# Patient Record
Sex: Male | Born: 1970 | Race: Black or African American | Hispanic: No | Marital: Married | Smoking: Former smoker
Health system: Southern US, Community
[De-identification: ages and names within clinical notes are randomized; demographics above are authoritative.]

## PROBLEM LIST (undated history)

## (undated) DIAGNOSIS — I10 Essential (primary) hypertension: Secondary | ICD-10-CM

## (undated) DIAGNOSIS — E119 Type 2 diabetes mellitus without complications: Secondary | ICD-10-CM

---

## 2019-10-06 ENCOUNTER — Emergency Department (HOSPITAL_COMMUNITY): Payer: Medicaid - Out of State

## 2019-10-06 ENCOUNTER — Emergency Department (HOSPITAL_COMMUNITY)
Admission: EM | Admit: 2019-10-06 | Discharge: 2019-10-06 | Disposition: A | Discharge status: Home / Self Care | Payer: Medicaid - Out of State | Attending: Emergency Medicine | Admitting: Emergency Medicine

## 2019-10-06 ENCOUNTER — Encounter (HOSPITAL_COMMUNITY): Payer: Self-pay

## 2019-10-06 ENCOUNTER — Other Ambulatory Visit: Payer: Self-pay

## 2019-10-06 DIAGNOSIS — U071 COVID-19: Secondary | ICD-10-CM | POA: Diagnosis not present

## 2019-10-06 DIAGNOSIS — E119 Type 2 diabetes mellitus without complications: Secondary | ICD-10-CM | POA: Insufficient documentation

## 2019-10-06 DIAGNOSIS — I10 Essential (primary) hypertension: Secondary | ICD-10-CM | POA: Diagnosis not present

## 2019-10-06 DIAGNOSIS — R05 Cough: Secondary | ICD-10-CM | POA: Diagnosis present

## 2019-10-06 DIAGNOSIS — Z20822 Contact with and (suspected) exposure to covid-19: Secondary | ICD-10-CM

## 2019-10-06 HISTORY — DX: Type 2 diabetes mellitus without complications: E11.9

## 2019-10-06 HISTORY — DX: Essential (primary) hypertension: I10

## 2019-10-06 MED ORDER — BENZONATATE 100 MG PO CAPS: 100.0000 mg | ORAL_CAPSULE | Freq: Three times a day (TID) | ORAL | 0 refills | Status: AC

## 2019-10-06 MED ORDER — ACETAMINOPHEN 500 MG PO TABS
1000.0000 mg | ORAL_TABLET | Freq: Once | ORAL | Status: AC
  Administered 2019-10-06: 16:00:00 1000 mg via ORAL
  Filled 2019-10-06: qty 2

## 2019-10-06 MED ORDER — ALBUTEROL SULFATE HFA 108 (90 BASE) MCG/ACT IN AERS
4.0000 | INHALATION_SPRAY | Freq: Once | RESPIRATORY_TRACT | Status: AC
  Administered 2019-10-06: 16:00:00 4 via RESPIRATORY_TRACT
  Filled 2019-10-06: qty 6.7

## 2019-10-06 MED ORDER — ACETAMINOPHEN 500 MG PO TABS: 500.0000 mg | ORAL_TABLET | Freq: Four times a day (QID) | ORAL | 0 refills | Status: AC | PRN

## 2019-10-06 NOTE — ED Provider Notes (Signed)
[facility]-EMERGENCY DEPT Provider Note   CSN: [id number] Arrival date & time: 10/06/19  1328     History Chief Complaint  Patient presents with  . Cough  . Generalized Body Aches  . Asthma    Jesse Washington is a 49 y.o. male.  The history is provided by the patient. No language interpreter was used.  Cough Asthma     49 year old male with history of asthma, diabetes, hypertension presents with cold symptoms.  Patient report for the past 3 days he has had fever, chills, body aches, pain in his back, legs, having nonproductive cough, shortness of breath, generalized weakness, decrease in appetite, headache.  Symptoms moderate in severity, no associated loss of taste or smell no nausea vomiting or diarrhea no dysuria or rash.  Denies any recent sick contact.  He lives in [place], and have been down in [place] for the past month to be with his daughter.  He has been drinking plenty of fluid at home but not feeling any better.  He denies any recent exposure to COVID-19.  Does have history of asthma but denies any increased wheezing.  Past Medical History:  Diagnosis Date  . Diabetes mellitus without complication (HCC)   . Hypertension     There are no problems to display for this patient.   History reviewed. No pertinent surgical history.     History reviewed. No pertinent family history.  Social History   Tobacco Use  . Smoking status: Not on file  Substance Use Topics  . Alcohol use: Not on file  . Drug use: Not on file    Home Medications Prior to Admission medications   Not on File    Allergies    Ciprofloxacin  Review of Systems   Review of Systems  Respiratory: Positive for cough.   All other systems reviewed and are negative.   Physical Exam Updated Vital Signs BP (!) 133/92   Pulse (!) 121   Temp (!) 100.7 F (38.2 C) (Oral)   Resp 20   SpO2 97%   Physical Exam Vitals and nursing note reviewed.    Constitutional:      General: He is not in acute distress.    Appearance: He is well-developed. He is obese.  HENT:     Head: Atraumatic.  Eyes:     Conjunctiva/sclera: Conjunctivae normal.  Cardiovascular:     Rate and Rhythm: Tachycardia present.     Pulses: Normal pulses.     Heart sounds: Normal heart sounds.  Pulmonary:     Breath sounds: No wheezing, rhonchi or rales.  Abdominal:     Palpations: Abdomen is soft.     Tenderness: There is no abdominal tenderness.  Musculoskeletal:     Cervical back: Neck supple.  Skin:    Findings: No rash.  Neurological:     Mental Status: He is alert and oriented to person, place, and time.  Psychiatric:        Mood and Affect: Mood normal.     ED Results / Procedures / Treatments   Labs (all labs ordered are listed, but only abnormal results are displayed) Labs Reviewed  SARS CORONAVIRUS 2 (TAT 6-24 HRS)    EKG None  Radiology DG Chest 2 View  Result Date: 10/06/2019 CLINICAL DATA:  Cough and pain.  Hypertension. EXAM: CHEST - 2 VIEW COMPARISON:  None. FINDINGS: Lungs are clear. Heart size and pulmonary vascularity are normal. No adenopathy. No pneumothorax. There is degenerative  change in the thoracic spine. IMPRESSION: Lungs clear.  Cardiac silhouette normal.  No adenopathy. Electronically Signed   By: Lowella Grip III M.D.   On: 10/06/2019 14:28    Procedures Procedures (including critical care time)  Medications Ordered in ED Medications  acetaminophen (TYLENOL) tablet 1,000 mg (1,000 mg Oral Given 10/06/19 1557)  albuterol (VENTOLIN HFA) 108 (90 Base) MCG/ACT inhaler 4 puff (4 puffs Inhalation Given 10/06/19 1558)    ED Course  I have reviewed the triage vital signs and the nursing notes.  Pertinent labs & imaging results that were available during my care of the patient were reviewed by me and considered in my medical decision making (see chart for details).    MDM Rules/Calculators/A&P                       BP (!) 133/92   Pulse (!) 121   Temp (!) 100.7 F (38.2 C) (Oral)   Resp 20   SpO2 97%   Final Clinical Impression(s) / ED Diagnoses Final diagnoses:  Suspected COVID-19 virus infection    Rx / DC Orders ED Discharge Orders         Ordered    acetaminophen (TYLENOL) 500 MG tablet  Every 6 hours PRN     10/06/19 1705    benzonatate (TESSALON) 100 MG capsule  Every 8 hours     10/06/19 1705         3:53 PM Patient presents with symptoms concerning for COVID-19.  Fortunately he is not hypoxic or hypotensive.  Does have an elevated temperature of 100.7 and tachycardic with a heart rate of 121.  Will give Tylenol, fluid, obtain COVID-19 test and will reassess.  Jesse Washington was evaluated in Emergency Department on 10/06/2019 for the symptoms described in the history of present illness. He was evaluated in the context of the global COVID-19 pandemic, which necessitated consideration that the patient might be at risk for infection with the SARS-CoV-2 virus that causes COVID-19. Institutional protocols and algorithms that pertain to the evaluation of patients at risk for COVID-19 are in a state of rapid change based on information released by regulatory bodies including the CDC and federal and state organizations. These policies and algorithms were followed during the patient's care in the ED.    Domenic Moras, PA-C 10/06/19 1925    Nat Christen, MD 10/07/19 [phone]

## 2019-10-06 NOTE — ED Triage Notes (Addendum)
Patient c/o cough and body aches X3 days.   Denies chest pain  Also c/o ankle pain and back pain X1 month from a previous injury.    Temp-100.7  Patient from [place] and has been living here for 1 month.    Hx. Asthma, DM, HTN   Allergy-cipro-rash

## 2019-10-06 NOTE — Discharge Instructions (Signed)
Your symptoms are concerning for COVID-19 infection.  A test have been obtained and should result in the next 24 hrs.  Please check MyChart, link below for result.  Follow instruction below. Return if you have any concerns.

## 2019-10-06 NOTE — ED Notes (Signed)
An After Visit Summary was printed and given to the patient. Discharge instructions given and no further questions at this time.  Pt denies SOB.  

## 2019-10-06 NOTE — ED Notes (Signed)
ED Provider at bedside. 

## 2019-10-07 ENCOUNTER — Emergency Department (HOSPITAL_COMMUNITY)
Admission: EM | Admit: 2019-10-07 | Discharge: 2019-10-08 | Disposition: A | Discharge status: Home / Self Care | Payer: Medicaid - Out of State | Attending: Emergency Medicine | Admitting: Emergency Medicine

## 2019-10-07 ENCOUNTER — Encounter (HOSPITAL_COMMUNITY): Payer: Self-pay

## 2019-10-07 ENCOUNTER — Emergency Department (HOSPITAL_COMMUNITY): Payer: Medicaid - Out of State

## 2019-10-07 ENCOUNTER — Telehealth: Payer: Self-pay

## 2019-10-07 DIAGNOSIS — U071 COVID-19: Secondary | ICD-10-CM | POA: Diagnosis not present

## 2019-10-07 DIAGNOSIS — R739 Hyperglycemia, unspecified: Secondary | ICD-10-CM

## 2019-10-07 DIAGNOSIS — R52 Pain, unspecified: Secondary | ICD-10-CM

## 2019-10-07 DIAGNOSIS — E119 Type 2 diabetes mellitus without complications: Secondary | ICD-10-CM | POA: Diagnosis not present

## 2019-10-07 DIAGNOSIS — Z794 Long term (current) use of insulin: Secondary | ICD-10-CM | POA: Diagnosis not present

## 2019-10-07 DIAGNOSIS — R0602 Shortness of breath: Secondary | ICD-10-CM | POA: Diagnosis not present

## 2019-10-07 DIAGNOSIS — I10 Essential (primary) hypertension: Secondary | ICD-10-CM | POA: Diagnosis not present

## 2019-10-07 DIAGNOSIS — Z79899 Other long term (current) drug therapy: Secondary | ICD-10-CM | POA: Diagnosis not present

## 2019-10-07 DIAGNOSIS — R509 Fever, unspecified: Secondary | ICD-10-CM | POA: Diagnosis present

## 2019-10-07 LAB — URINALYSIS, ROUTINE W REFLEX MICROSCOPIC
Bacteria, UA: NONE SEEN
Bilirubin Urine: NEGATIVE
Glucose, UA: >=500 mg/dL — AB
Hgb urine dipstick: NEGATIVE
Ketones, ur: 80 mg/dL — AB
Leukocytes,Ua: NEGATIVE
Nitrite: NEGATIVE
Protein, ur: 30 mg/dL — AB
Specific Gravity, Urine: 1.032 — ABNORMAL HIGH (ref 1.005–1.030)
pH: 5 (ref 5.0–8.0)

## 2019-10-07 LAB — CBC WITH DIFFERENTIAL/PLATELET
Abs Immature Granulocytes: 0.04 10*3/uL (ref 0.00–0.07)
Basophils Absolute: 0.1 10*3/uL (ref 0.0–0.1)
Basophils Relative: 1 %
Eosinophils Absolute: 0.1 10*3/uL (ref 0.0–0.5)
Eosinophils Relative: 1 %
HCT: 49.5 % (ref 39.0–52.0)
Hemoglobin: 16.1 g/dL (ref 13.0–17.0)
Immature Granulocytes: 0 %
Lymphocytes Relative: 24 %
Lymphs Abs: 2.4 10*3/uL (ref 0.7–4.0)
MCH: 30.8 pg (ref 26.0–34.0)
MCHC: 32.5 g/dL (ref 30.0–36.0)
MCV: 94.6 fL (ref 80.0–100.0)
Monocytes Absolute: 2.1 10*3/uL — ABNORMAL HIGH (ref 0.1–1.0)
Monocytes Relative: 21 %
Neutro Abs: 5.3 10*3/uL (ref 1.7–7.7)
Neutrophils Relative %: 53 %
Platelets: 249 10*3/uL (ref 150–400)
RBC: 5.23 MIL/uL (ref 4.22–5.81)
RDW: 13.7 % (ref 11.5–15.5)
WBC: 9.9 10*3/uL (ref 4.0–10.5)
nRBC: 0 % (ref 0.0–0.2)

## 2019-10-07 LAB — COMPREHENSIVE METABOLIC PANEL
ALT: 33 U/L (ref 0–44)
AST: 38 U/L (ref 15–41)
Albumin: 3.9 g/dL (ref 3.5–5.0)
Alkaline Phosphatase: 93 U/L (ref 38–126)
Anion gap: 13 (ref 5–15)
BUN: 17 mg/dL (ref 6–20)
CO2: 23 mmol/L (ref 22–32)
Calcium: 9.3 mg/dL (ref 8.9–10.3)
Chloride: 94 mmol/L — ABNORMAL LOW (ref 98–111)
Creatinine, Ser: 1.63 mg/dL — ABNORMAL HIGH (ref 0.61–1.24)
GFR calc Af Amer: 56 mL/min — ABNORMAL LOW (ref >60)
GFR calc non Af Amer: 49 mL/min — ABNORMAL LOW (ref >60)
Glucose, Bld: 356 mg/dL — ABNORMAL HIGH (ref 70–99)
Potassium: 4.3 mmol/L (ref 3.5–5.1)
Sodium: 130 mmol/L — ABNORMAL LOW (ref 135–145)
Total Bilirubin: 1.3 mg/dL — ABNORMAL HIGH (ref 0.3–1.2)
Total Protein: 8.8 g/dL — ABNORMAL HIGH (ref 6.5–8.1)

## 2019-10-07 LAB — SARS CORONAVIRUS 2 (TAT 6-24 HRS): SARS Coronavirus 2: POSITIVE — AB

## 2019-10-07 MED ORDER — SODIUM CHLORIDE 0.9 % IV BOLUS
1000.0000 mL | Freq: Once | INTRAVENOUS | Status: AC
  Administered 2019-10-07: 23:00:00 1000 mL via INTRAVENOUS

## 2019-10-07 MED ORDER — SODIUM CHLORIDE (PF) 0.9 % IJ SOLN
INTRAMUSCULAR | Status: AC
  Filled 2019-10-07: qty 50

## 2019-10-07 MED ORDER — KETOROLAC TROMETHAMINE 30 MG/ML IJ SOLN
30.0000 mg | Freq: Once | INTRAMUSCULAR | Status: AC
  Administered 2019-10-07: 22:00:00 30 mg via INTRAVENOUS
  Filled 2019-10-07: qty 1

## 2019-10-07 MED ORDER — ALBUTEROL SULFATE HFA 108 (90 BASE) MCG/ACT IN AERS
5.0000 | INHALATION_SPRAY | Freq: Once | RESPIRATORY_TRACT | Status: AC
  Administered 2019-10-07: 22:00:00 5 via RESPIRATORY_TRACT
  Filled 2019-10-07: qty 6.7

## 2019-10-07 MED ORDER — DEXAMETHASONE SODIUM PHOSPHATE 10 MG/ML IJ SOLN
10.0000 mg | Freq: Once | INTRAMUSCULAR | Status: AC
  Administered 2019-10-07: 10 mg via INTRAVENOUS
  Filled 2019-10-07: qty 1

## 2019-10-07 MED ORDER — IOHEXOL 350 MG/ML SOLN
100.0000 mL | Freq: Once | INTRAVENOUS | Status: AC | PRN
  Administered 2019-10-08: 01:00:00 100 mL via INTRAVENOUS

## 2019-10-07 NOTE — ED Notes (Signed)
Ambulated pt 30 feet.  Pt O2 while lying in bed 100% on room air Pt O2 while ambulating 97% on room air.

## 2019-10-07 NOTE — Telephone Encounter (Signed)
This encounter was created in error - please disregard.

## 2019-10-07 NOTE — Telephone Encounter (Signed)
Pt given Covid-19 positive results. Discussed mild, moderate and severe symptoms. Advised pt to call 911 for any respiratory issues and/dehydration. Discussed non test criteria for ending self isolation. Pt advised of way to manage symptoms at home and review isolation precautions especially the importance of washing hands frequently and wearing a mask when around others. Pt verbalized understanding. Will report to HD. 

## 2019-10-07 NOTE — ED Provider Notes (Signed)
[facility] DEPT Provider Note   CSN: [id number] Arrival date & time: 10/07/19  1907     History Chief Complaint  Patient presents with  . COVID +  . Shortness of Breath  . Cough    Jesse Washington is a 49 y.o. male with history of diabetes mellitus, hypertension, asthma presenting for evaluation of acute onset, progressively worsening fevers, shortness of breath, cough, generalized body aches for 4 days.  He was recently seen and evaluated yesterday for the symptoms noted to be Covid positive, initially febrile on presentation but was found to be stable for discharge home.  He tells me that since then he has had worsening shortness of breath and body aches and has "barely been able to get out of bed".  Notes mild lower abdominal pain but denies urinary symptoms, nausea or vomiting.  He had one episode of watery nonbloody diarrhea earlier today.  Notes all over body pains including in his chest and with bilateral lower extremities but no leg swelling.  Pain in the chest worsens with cough and deep inspiration.  He has been taking ibuprofen and Tylenol without relief of symptoms.  He recently traveled from [place] to [place] 1 month ago.  Reports that they drove but that they did take breaks.  No recent surgeries, no hemoptysis, no prior history of DVT or PE.  He is a former smoker.  He is on insulin for his diabetes but has not been able to check his blood sugars as he has run out of his glucometer strips.  The history is provided by the patient.       Past Medical History:  Diagnosis Date  . Diabetes mellitus without complication ([place])   . Hypertension     There are no problems to display for this patient.   No past surgical history on file.     No family history on file.  Social History   Tobacco Use  . Smoking status: Not on file  Substance Use Topics  . Alcohol use: Not on file  . Drug use: Not on file    Home  Medications Prior to Admission medications   Medication Sig Start Date End Date Taking? Authorizing Provider  acetaminophen (TYLENOL) 500 MG tablet Take 1 tablet (500 mg total) by mouth every 6 (six) hours as needed. 10/06/19   Domenic Moras, PA-C  albuterol (VENTOLIN HFA) 108 (90 Base) MCG/ACT inhaler Inhale 2 puffs into the lungs every 6 (six) hours as needed for shortness of breath.    [provider]  azithromycin (ZITHROMAX Z-PAK) 250 MG tablet Take 1 tablet (250 mg total) by mouth daily. 10/08/19   Charlann Lange, PA-C  benzonatate (TESSALON) 100 MG capsule Take 1 capsule (100 mg total) by mouth every 8 (eight) hours. 10/06/19   Domenic Moras, PA-C  budesonide-formoterol [facility]) 160-4.5 MCG/ACT inhaler Inhale 2 puffs into the lungs 2 (two) times daily. 10/08/19   Charlann Lange, PA-C  Insulin Glargine (BASAGLAR KWIKPEN) 100 UNIT/ML SOPN Inject 0.2 mLs (20 Units total) into the skin 2 (two) times daily. 10/08/19   Charlann Lange, PA-C  insulin lispro (HUMALOG) 100 UNIT/ML injection Per your known sliding scale 10/08/19   Charlann Lange, PA-C  oxyCODONE-acetaminophen (PERCOCET) 10-325 MG tablet Take 1 tablet by mouth every 4 (four) hours as needed for pain.    [provider]  predniSONE (DELTASONE) 10 MG tablet Take 5 on day 1 (10/08/19) Take 4 on day 2 Take 3 on day  4 Take 2 on day 5 Take 1 on day 6 10/08/19   Elpidio Anis, PA-C    Allergies    Ciprofloxacin  Review of Systems   Review of Systems  Constitutional: Positive for chills, fatigue and fever.  Respiratory: Positive for cough and shortness of breath.   Cardiovascular: Positive for chest pain. Negative for leg swelling.  Gastrointestinal: Positive for abdominal pain and diarrhea. Negative for nausea and vomiting.  Musculoskeletal: Positive for myalgias.  Neurological: Positive for weakness (generalized).  All other systems reviewed and are negative.   Physical Exam Updated Vital Signs BP 126/71   Pulse 96    Temp 99.1 F (37.3 C) (Oral)   Resp 19   Ht 6\' 1"  (1.854 m)   Wt (!) 172.4 kg   SpO2 93%   BMI 50.13 kg/m   Physical Exam Vitals and nursing note reviewed.  Constitutional:      General: He is not in acute distress.    Appearance: He is well-developed. He is obese.  HENT:     Head: Normocephalic and atraumatic.  Eyes:     General:        Right eye: No discharge.        Left eye: No discharge.     Conjunctiva/sclera: Conjunctivae normal.  Neck:     Vascular: No JVD.     Trachea: No tracheal deviation.  Cardiovascular:     Rate and Rhythm: Normal rate and regular rhythm.  Pulmonary:     Effort: Tachypnea present.     Comments: Examination limited due to body habitus but globally diminished breath sounds.  Speaking in short phrases.  PO2 saturations 95% on room air at rest Abdominal:     General: Bowel sounds are decreased. There is no distension.     Palpations: Abdomen is soft.     Tenderness: There is no abdominal tenderness. There is no guarding.     Hernia: A hernia is present.     Comments: Large hernia easily reducible, patient reports it is been present for a long time.  Musculoskeletal:     Right lower leg: No edema.     Left lower leg: Tenderness present. No edema.  Skin:    General: Skin is warm and dry.     Findings: No erythema.  Neurological:     Mental Status: He is alert.  Psychiatric:        Behavior: Behavior normal.     ED Results / Procedures / Treatments   Labs (all labs ordered are listed, but only abnormal results are displayed) Labs Reviewed  COMPREHENSIVE METABOLIC PANEL - Abnormal; Notable for the following components:      Result Value   Sodium 130 (*)    Chloride 94 (*)    Glucose, Bld 356 (*)    Creatinine, Ser 1.63 (*)    Total Protein 8.8 (*)    Total Bilirubin 1.3 (*)    GFR calc non Af Amer 49 (*)    GFR calc Af Amer 56 (*)    All other components within normal limits  CBC WITH DIFFERENTIAL/PLATELET - Abnormal; Notable for  the following components:   Monocytes Absolute 2.1 (*)    All other components within normal limits  URINALYSIS, ROUTINE W REFLEX MICROSCOPIC - Abnormal; Notable for the following components:   APPearance HAZY (*)    Specific Gravity, Urine 1.032 (*)    Glucose, UA >=500 (*)    Ketones, ur 80 (*)    Protein,  ur 30 (*)    All other components within normal limits  I-STAT CHEM 8, ED - Abnormal; Notable for the following components:   Sodium 130 (*)    Potassium 6.5 (*)    BUN 23 (*)    Creatinine, Ser 1.40 (*)    Glucose, Bld 347 (*)    Calcium, Ion 1.05 (*)    All other components within normal limits  CBG MONITORING, ED - Abnormal; Notable for the following components:   Glucose-Capillary 445 (*)    All other components within normal limits  CBG MONITORING, ED - Abnormal; Notable for the following components:   Glucose-Capillary 470 (*)    All other components within normal limits  TROPONIN I (HIGH SENSITIVITY)  TROPONIN I (HIGH SENSITIVITY)    EKG EKG Interpretation  Date/Time:  Sunday October 08 2019 03:38:26 EST Ventricular Rate:  97 PR Interval:    QRS Duration: 91 QT Interval:  346 QTC Calculation: 440 R Axis:   52 Text Interpretation: Sinus rhythm ST elevation suggests acute pericarditis Early repolarization (normal variant) No old tracing to compare Confirmed by Devoria Albe ([id number]) on 10/08/2019 3:45:19 AM   Radiology CT Angio Chest PE W and/or Wo Contrast  Result Date: 10/08/2019 CLINICAL DATA:  Shortness of breath. COVID positive yesterday. Cough and body aches. EXAM: CT ANGIOGRAPHY CHEST WITH CONTRAST TECHNIQUE: Multidetector CT imaging of the chest was performed using the standard protocol during bolus administration of intravenous contrast. Multiplanar CT image reconstructions and MIPs were obtained to evaluate the vascular anatomy. CONTRAST:  OMNIPAQUE IOHEXOL 350 MG/ML SOLN COMPARISON:  Radiograph yesterday. Radiograph 10/06/2019 FINDINGS: Cardiovascular:  Significantly limited evaluation for pulmonary embolus given contrast bolus timing and soft tissue attenuation from habitus. No filling defects in the central most pulmonary arteries. Cannot assess distal to the lobar pulmonary branches. Upper normal heart size. No pericardial effusion. Thoracic aorta is normal in caliber. No aortic dissection. Mediastinum/Nodes: Shotty mediastinal lymph nodes, all subcentimeter and likely reactive. No hilar adenopathy. Decompressed esophagus. No suspicious thyroid nodule. Lungs/Pleura: Breathing motion artifact limits detailed assessment. Only minimal peripheral ground-glass opacities in the left upper lobe. No other focal airspace disease. No pleural effusion. No septal thickening or pulmonary edema. Trachea and mainstem bronchi are patent. Upper Abdomen: Partially included left upper abdominal ventral abdominal wall hernia. Musculoskeletal: Multilevel degenerative change in the spine. There are no acute or suspicious osseous abnormalities. Review of the MIP images confirms the above findings. IMPRESSION: 1. Limited evaluation for pulmonary embolus given contrast bolus timing and soft tissue attenuation from habitus. No filling defects in the central most pulmonary arteries. Cannot assess distal to the lobar branches. 2. Minimal peripheral ground-glass opacities in the left upper lobe, likely infectious in the setting of COVID-19 infection. Minimal parenchymal involvement. Electronically Signed   By: Narda Rutherford M.D.   On: 10/08/2019 01:28   DG Chest Portable 1 View  Result Date: 10/07/2019 CLINICAL DATA:  COVID-19 positive EXAM: PORTABLE CHEST 1 VIEW COMPARISON:  Radiograph 10/06/2019 FINDINGS: Increased opacity in the lungs likely related to body habitus. No focal consolidative process is seen. Indeterminate radiodensity projecting to the right of the upper mediastinum. No pneumothorax or effusion. The cardiomediastinal contours are unremarkable. No other acute osseous  or soft tissue abnormality. IMPRESSION: Accounting for body habitus, the lungs are clear. Indeterminate metallic radiodensity projecting over the upper chest/paramediastinal border. Possibly external to the patient. Correlate with inspection. Electronically Signed   By: Kreg Shropshire M.D.   On: 10/07/2019 20:56  Procedures Procedures (including critical care time)  Medications Ordered in ED Medications  ketorolac (TORADOL) 30 MG/ML injection 30 mg (30 mg Intravenous Given 10/07/19 2201)  dexamethasone (DECADRON) injection 10 mg (10 mg Intravenous Given 10/07/19 2204)  albuterol (VENTOLIN HFA) 108 (90 Base) MCG/ACT inhaler 5 puff (5 puffs Inhalation Given 10/07/19 2211)  sodium chloride 0.9 % bolus 1,000 mL (0 mLs Intravenous Stopped 10/08/19 0021)  iohexol (OMNIPAQUE) 350 MG/ML injection 100 mL (100 mLs Intravenous Contrast Given 10/08/19 0036)  LORazepam (ATIVAN) injection 1 mg (1 mg Intravenous Given 10/08/19 0042)  insulin glargine (LANTUS) injection 22 Units (22 Units Subcutaneous Given 10/08/19 0325)  insulin aspart (novoLOG) injection 8 Units (8 Units Subcutaneous Given 10/08/19 0325)  azithromycin (ZITHROMAX) tablet 500 mg (500 mg Oral Given 10/08/19 0330)  insulin aspart (novoLOG) injection 8 Units (8 Units Subcutaneous Given 10/08/19 [id number])    ED Course  I have reviewed the triage vital signs and the nursing notes.  Pertinent labs & imaging results that were available during my care of the patient were reviewed by me and considered in my medical decision making (see chart for details).    MDM Rules/Calculators/A&P                      Mardene CelesteWilliam V Alvi was evaluated in Emergency Department on 10/08/2019 for the symptoms described in the history of present illness. He was evaluated in the context of the global COVID-19 pandemic, which necessitated consideration that the patient might be at risk for infection with the SARS-CoV-2 virus that causes COVID-19. Institutional protocols and  algorithms that pertain to the evaluation of patients at risk for COVID-19 are in a state of rapid change based on information released by regulatory bodies including the CDC and federal and state organizations. These policies and algorithms were followed during the patient's care in the ED.  Patient known Covid positive was seen in the ED yesterday returns today for evaluation of worsening symptoms.  He is febrile and tachycardic in the ED.  He was given IV Toradol as he recently had Tylenol prior to arrival to the ED.  Repeat radiographs show no consolidation or effusion but limited due to body habitus.  He has not been checking his sugars at home running out of glucometer strips and was hypoglycemic with EMS.  Lab work reviewed by me shows no leukocytosis, no anemia, hyperglycemic but not in DKA as anion gap and bicarb are within normal limits. His creatinine is elevated but BUN is within normal limits.  UA suggests dehydration with elevated specific gravity and ketones.Will give IV fluids and recheck sugar.  He was ambulated in the ED with stable SPO2 saturations.  He is not requiring supplemental oxygen at this time.  However, with recent history of travel [place] and tachycardia we will obtain a CTA of the chest to rule out PE.  12:00AM Signed out care to oncoming provider PA [place].  Pending CTA of chest.  If CTA negative for PE and his O2 sats remained stable and sugars improved he will likely be stable for discharge home with symptomatic management.  Otherwise may require admission to the hospital for further evaluation and management.  I would also refill his medications.  Final Clinical Impression(s) / ED Diagnoses Final diagnoses:  COVID-19 virus infection  Hyperglycemia  Generalized body aches    Rx / DC Orders ED Discharge Orders         Ordered    insulin  lispro (HUMALOG) 100 UNIT/ML injection     10/08/19 0319    Insulin Glargine (BASAGLAR KWIKPEN) 100 UNIT/ML  SOPN  2 times daily     10/08/19 0319    budesonide-formoterol (SYMBICORT) 160-4.5 MCG/ACT inhaler  2 times daily     10/08/19 0319    predniSONE (DELTASONE) 10 MG tablet     10/08/19 0319    azithromycin (ZITHROMAX Z-PAK) 250 MG tablet  Daily     10/08/19 0320           Jeanie Sewer, PA-C 10/08/19 1803    Charlynne Pander, MD 10/08/19 2317

## 2019-10-07 NOTE — ED Triage Notes (Signed)
Pt arrived via [facility] with complaints of increasing SOB, body aches, and cough. Pt tested positive for COVID yesterday here on his last visit.   BP 150/96 HR 120 RR 20 SpO2 96% RA Temp 97.3  CBG 315

## 2019-10-08 ENCOUNTER — Emergency Department (HOSPITAL_COMMUNITY): Payer: Medicaid - Out of State

## 2019-10-08 ENCOUNTER — Other Ambulatory Visit: Payer: Self-pay

## 2019-10-08 LAB — TROPONIN I (HIGH SENSITIVITY): Troponin I (High Sensitivity): 8 ng/L (ref <18)

## 2019-10-08 LAB — CBG MONITORING, ED
Glucose-Capillary: 445 mg/dL — ABNORMAL HIGH (ref 70–99)
Glucose-Capillary: 470 mg/dL — ABNORMAL HIGH (ref 70–99)

## 2019-10-08 MED ORDER — INSULIN ASPART 100 UNIT/ML ~~LOC~~ SOLN
8.0000 [IU] | Freq: Once | SUBCUTANEOUS | Status: AC
  Administered 2019-10-08: 07:00:00 8 [IU] via SUBCUTANEOUS
  Filled 2019-10-08: qty 0.08

## 2019-10-08 MED ORDER — AZITHROMYCIN 250 MG PO TABS: 250.0000 mg | ORAL_TABLET | Freq: Every day | ORAL | 0 refills | Status: DC

## 2019-10-08 MED ORDER — LORAZEPAM 2 MG/ML IJ SOLN
1.0000 mg | Freq: Once | INTRAMUSCULAR | Status: AC
  Administered 2019-10-08: 1 mg via INTRAVENOUS
  Filled 2019-10-08: qty 1

## 2019-10-08 MED ORDER — PREDNISONE 10 MG PO TABS: ORAL_TABLET | ORAL | 0 refills | Status: DC

## 2019-10-08 MED ORDER — INSULIN ASPART 100 UNIT/ML ~~LOC~~ SOLN
8.0000 [IU] | Freq: Once | SUBCUTANEOUS | Status: AC
  Administered 2019-10-08: 8 [IU] via SUBCUTANEOUS
  Filled 2019-10-08: qty 0.08

## 2019-10-08 MED ORDER — INSULIN LISPRO 100 UNIT/ML ~~LOC~~ SOLN: SUBCUTANEOUS | 1 refills | Status: AC

## 2019-10-08 MED ORDER — BASAGLAR KWIKPEN 100 UNIT/ML ~~LOC~~ SOPN: 20.0000 [IU] | PEN_INJECTOR | Freq: Two times a day (BID) | SUBCUTANEOUS | 2 refills | Status: AC

## 2019-10-08 MED ORDER — AZITHROMYCIN 250 MG PO TABS
500.0000 mg | ORAL_TABLET | Freq: Once | ORAL | Status: AC
  Administered 2019-10-08: 500 mg via ORAL
  Filled 2019-10-08: qty 2

## 2019-10-08 MED ORDER — BUDESONIDE-FORMOTEROL FUMARATE 160-4.5 MCG/ACT IN AERO: 2.0000 | INHALATION_SPRAY | Freq: Two times a day (BID) | RESPIRATORY_TRACT | 0 refills | Status: AC

## 2019-10-08 MED ORDER — INSULIN GLARGINE 100 UNIT/ML ~~LOC~~ SOLN
22.0000 [IU] | Freq: Once | SUBCUTANEOUS | Status: AC
  Administered 2019-10-08: 03:00:00 22 [IU] via SUBCUTANEOUS
  Filled 2019-10-08: qty 0.22

## 2019-10-08 NOTE — Discharge Instructions (Addendum)
Take 1 to 2 tablets of Tylenol every 6 hours as needed for fever pain. Recommend taking an aspirin daily as well.  Continue taking all of your home medications. New prescriptions have been written for you. Lancets are available without a prescription. Take prednisone as prescribed beginning tomorrow.  You received the first dose in the emergency department today. Use albuterol inhaler 1 to 2 puffs every 4-6 hours as needed for shortness of breath. Check your blood sugars closely as they were elevated today.   I would recommend that you purchase a device called a pulse oximeter which checks oxygen levels.  Seek immediate evaluation if your oxygen levels dropped below 90%.  Follow-up with primary care provider for reevaluation of symptoms.  Return to the emergency department if any concerning signs or symptoms develop such as persistent vomiting, worsening shortness of breath or chest pains, worsening blood sugars, or loss of consciousness.

## 2019-10-08 NOTE — ED Notes (Signed)
Pt calling family to come pick up, they are not answering. Pt asked to wait in room for a few more minutes.

## 2019-10-08 NOTE — ED Notes (Signed)
Pt ambulated in room. Pt SpO2 sat stayed above 94% on room air.

## 2019-10-08 NOTE — ED Provider Notes (Addendum)
Fever, body aches, SOB, CP, AP, diarrhea - COVID positive Seen yesterday for same, returns for worsening symptoms No history of clots Pending CTA No hypoxia here Has been given Albuterol inhaler (was out) H/o DM D/ch with prednisone, glucometer strips  Consider admit with any hypoxia Recheck temp, tachycardia Review CTA  Patient has been closely monitored in the ED.  CTA is suboptimal study but does not show any filling defects in visualized structures. Will recommend aspirin daily given possibility of distal clot. Tachycardia significantly improved with defervescence. No hypoxia.   Blood sugar rises to 450. The patient reports he has been out of insulin for the past 2 weeks. Regular dosing provided in the ED, specifically 22 U Lantus (per pharmacy) and 8 U Novolog (per patient's stated sliding scale). No evidence DKA. No vomiting or nausea while in the ED.   The patient reports his main concern has been body aches. SOB improved with inhaler use.   Troponin done and is negative.   Final recheck of CBG 470. Additional 8 U SQ regular insulin provided. He is felt stable for discharge home.   He can be discharged home. Home medication Rx's filled for use until he can get home to [place]. Strict return precautions discussed.    Elpidio Anis, PA-C 10/08/19 0325    Elpidio Anis, PA-C 10/08/19 7741    Charlynne Pander, MD 10/08/19 1504

## 2019-10-08 NOTE — ED Notes (Signed)
Patient states that he has not gotten anyone to answer their phone for transportation home.

## 2019-10-09 ENCOUNTER — Telehealth: Payer: Self-pay | Admitting: Adult Health

## 2019-10-09 NOTE — Telephone Encounter (Signed)
Called to discuss with Olena Mater about Covid symptoms and the use of bamlanivimab, a monoclonal antibody infusion for those with mild to moderate Covid symptoms and at a high risk of hospitalization.     Pt is qualified for this infusion at the [facility] infusion center due to co-morbid conditions and/or a member of an at-risk group, however declines infusion at this time. Symptoms tier reviewed as well as criteria for ending isolation.  Symptoms reviewed that would warrant ED/Hospital evaluation. Preventative practices reviewed. Patient verbalized understanding. Patient advised to call back if he decides that he does want to get infusion. Callback number to the infusion center given. Patient advised to go to Urgent care or ED with severe symptoms. Last date he would be eligible for infusion is 10/14/19.       Kyce Ging NP-C  [facility]   10/09/2019

## 2019-10-10 ENCOUNTER — Other Ambulatory Visit: Payer: Self-pay

## 2019-10-10 ENCOUNTER — Encounter (HOSPITAL_COMMUNITY): Payer: Self-pay | Admitting: Emergency Medicine

## 2019-10-10 ENCOUNTER — Emergency Department (HOSPITAL_COMMUNITY): Payer: Medicaid - Out of State

## 2019-10-10 ENCOUNTER — Emergency Department (HOSPITAL_COMMUNITY)
Admission: EM | Admit: 2019-10-10 | Discharge: 2019-10-10 | Disposition: A | Discharge status: Home / Self Care | Payer: Medicaid - Out of State | Attending: Emergency Medicine | Admitting: Emergency Medicine

## 2019-10-10 DIAGNOSIS — Z881 Allergy status to other antibiotic agents status: Secondary | ICD-10-CM | POA: Diagnosis not present

## 2019-10-10 DIAGNOSIS — Z79899 Other long term (current) drug therapy: Secondary | ICD-10-CM | POA: Diagnosis not present

## 2019-10-10 DIAGNOSIS — Z794 Long term (current) use of insulin: Secondary | ICD-10-CM | POA: Insufficient documentation

## 2019-10-10 DIAGNOSIS — E119 Type 2 diabetes mellitus without complications: Secondary | ICD-10-CM | POA: Diagnosis not present

## 2019-10-10 DIAGNOSIS — U071 COVID-19: Secondary | ICD-10-CM | POA: Insufficient documentation

## 2019-10-10 DIAGNOSIS — I10 Essential (primary) hypertension: Secondary | ICD-10-CM | POA: Insufficient documentation

## 2019-10-10 DIAGNOSIS — R0602 Shortness of breath: Secondary | ICD-10-CM

## 2019-10-10 DIAGNOSIS — R05 Cough: Secondary | ICD-10-CM

## 2019-10-10 DIAGNOSIS — R509 Fever, unspecified: Secondary | ICD-10-CM

## 2019-10-10 DIAGNOSIS — R059 Cough, unspecified: Secondary | ICD-10-CM

## 2019-10-10 LAB — CBC WITH DIFFERENTIAL/PLATELET
Abs Immature Granulocytes: 0.05 10*3/uL (ref 0.00–0.07)
Basophils Absolute: 0.1 10*3/uL (ref 0.0–0.1)
Basophils Relative: 0 %
Eosinophils Absolute: 0 10*3/uL (ref 0.0–0.5)
Eosinophils Relative: 0 %
HCT: 48.6 % (ref 39.0–52.0)
Hemoglobin: 16.4 g/dL (ref 13.0–17.0)
Immature Granulocytes: 0 %
Lymphocytes Relative: 26 %
Lymphs Abs: 3.3 10*3/uL (ref 0.7–4.0)
MCH: 31.7 pg (ref 26.0–34.0)
MCHC: 33.7 g/dL (ref 30.0–36.0)
MCV: 94 fL (ref 80.0–100.0)
Monocytes Absolute: 1.7 10*3/uL — ABNORMAL HIGH (ref 0.1–1.0)
Monocytes Relative: 13 %
Neutro Abs: 7.7 10*3/uL (ref 1.7–7.7)
Neutrophils Relative %: 61 %
Platelets: 282 10*3/uL (ref 150–400)
RBC: 5.17 MIL/uL (ref 4.22–5.81)
RDW: 13.8 % (ref 11.5–15.5)
WBC: 12.8 10*3/uL — ABNORMAL HIGH (ref 4.0–10.5)
nRBC: 0.5 % — ABNORMAL HIGH (ref 0.0–0.2)

## 2019-10-10 LAB — I-STAT CHEM 8, ED
BUN: 23 mg/dL — ABNORMAL HIGH (ref 6–20)
Calcium, Ion: 1.05 mmol/L — ABNORMAL LOW (ref 1.15–1.40)
Chloride: 100 mmol/L (ref 98–111)
Creatinine, Ser: 1.4 mg/dL — ABNORMAL HIGH (ref 0.61–1.24)
Glucose, Bld: 347 mg/dL — ABNORMAL HIGH (ref 70–99)
HCT: 47 % (ref 39.0–52.0)
Hemoglobin: 16 g/dL (ref 13.0–17.0)
Potassium: 6.5 mmol/L (ref 3.5–5.1)
Sodium: 130 mmol/L — ABNORMAL LOW (ref 135–145)
TCO2: 26 mmol/L (ref 22–32)

## 2019-10-10 LAB — TROPONIN I (HIGH SENSITIVITY)
Troponin I (High Sensitivity): 4 ng/L (ref <18)
Troponin I (High Sensitivity): 4 ng/L (ref <18)

## 2019-10-10 LAB — BASIC METABOLIC PANEL
Anion gap: 13 (ref 5–15)
BUN: 23 mg/dL — ABNORMAL HIGH (ref 6–20)
CO2: 25 mmol/L (ref 22–32)
Calcium: 9.4 mg/dL (ref 8.9–10.3)
Chloride: 94 mmol/L — ABNORMAL LOW (ref 98–111)
Creatinine, Ser: 1.9 mg/dL — ABNORMAL HIGH (ref 0.61–1.24)
GFR calc Af Amer: 47 mL/min — ABNORMAL LOW (ref >60)
GFR calc non Af Amer: 40 mL/min — ABNORMAL LOW (ref >60)
Glucose, Bld: 397 mg/dL — ABNORMAL HIGH (ref 70–99)
Potassium: 3.9 mmol/L (ref 3.5–5.1)
Sodium: 132 mmol/L — ABNORMAL LOW (ref 135–145)

## 2019-10-10 MED ORDER — DEXAMETHASONE SODIUM PHOSPHATE 10 MG/ML IJ SOLN
10.0000 mg | Freq: Once | INTRAMUSCULAR | Status: AC
  Administered 2019-10-10: 13:00:00 10 mg via INTRAVENOUS
  Filled 2019-10-10: qty 1

## 2019-10-10 MED ORDER — PROMETHAZINE-DM 6.25-15 MG/5ML PO SYRP: 2.5000 mL | ORAL_SOLUTION | Freq: Four times a day (QID) | ORAL | 0 refills | Status: AC | PRN

## 2019-10-10 MED ORDER — ALBUTEROL SULFATE HFA 108 (90 BASE) MCG/ACT IN AERS
8.0000 | INHALATION_SPRAY | Freq: Once | RESPIRATORY_TRACT | Status: AC
  Administered 2019-10-10: 8 via RESPIRATORY_TRACT
  Filled 2019-10-10: qty 6.7

## 2019-10-10 MED ORDER — ACETAMINOPHEN 500 MG PO TABS
1000.0000 mg | ORAL_TABLET | Freq: Once | ORAL | Status: AC
  Administered 2019-10-10: 1000 mg via ORAL
  Filled 2019-10-10: qty 2

## 2019-10-10 MED ORDER — SODIUM CHLORIDE 0.9 % IV BOLUS
1000.0000 mL | Freq: Once | INTRAVENOUS | Status: AC
  Administered 2019-10-10: 1000 mL via INTRAVENOUS

## 2019-10-10 MED ORDER — PREDNISONE 20 MG PO TABS: 40.0000 mg | ORAL_TABLET | Freq: Every day | ORAL | 0 refills | Status: DC

## 2019-10-10 NOTE — ED Notes (Signed)
Patient ambulated with pulse ox, saturation ranged from 93-97% RA during ambulation.

## 2019-10-10 NOTE — Discharge Instructions (Signed)
Take cough medication as directed.  Take prednisone as directed.  Continue using your albuterol inhaler as directed.  Return to the emergency department for any worsening shortness of breath, chest pain, cough, vomiting or any other worsening or concerning symptoms.

## 2019-10-10 NOTE — ED Triage Notes (Signed)
Per EMS-states he was diagnosed with covid 1/16-states SOB, cough and weak

## 2019-10-10 NOTE — ED Provider Notes (Signed)
[facility] DEPT Provider Note   CSN: [id number] Arrival date & time: 10/10/19  1113     History Chief Complaint  Patient presents with  . covid positive  . Cough    Jesse Washington is a 49 y.o. male consult history of diabetes, hypertension, asthma with recent COVID-19 infection who presents for evaluation of cough, shortness of breath.  He is recently diagnosed with COVID-19 on 10/06/2019.  He reports that he has been symptomatic for a total of 5 to 6 days.  He was seen in the ED on 10/08/2019 for evaluation of similar symptoms.  He states that he was discharged home with Jesse Washington.  He comes back in today because he cannot stop coughing.  He he states that when he gets into a coughing fit, he feels like he cannot catch his breath.  He does have some mild shortness of breath without coughing but states that he feels like the coughing is making it worse.  He states that he has history of asthma and has been using his inhaler with minimal improvement.  He states he has also been taking Tessalon Perles but they have not been helping.  He reports chest soreness and abdominal soreness secondary to coughing. No other chest pain. He states he has not been vomiting but states his cough has been productive of phlegm.  He has not measured any recent fevers. He denies any exogenous hormone use, recent immobilization, prior history of DVT/PE, recent surgery, leg swelling, or long travel.  The history is provided by the patient.       Past Medical History:  Diagnosis Date  . Diabetes mellitus without complication ([place])   . Hypertension     There are no problems to display for this patient.   History reviewed. No pertinent surgical history.     No family history on file.  Social History   Tobacco Use  . Smoking status: Not on file  Substance Use Topics  . Alcohol use: Not on file  . Drug use: Not on file    Home Medications Prior to Admission  medications   Medication Sig Start Date End Date Taking? Authorizing Provider  acetaminophen (TYLENOL) 500 MG tablet Take 1 tablet (500 mg total) by mouth every 6 (six) hours as needed. 10/06/19   Domenic Moras, PA-C  albuterol (VENTOLIN HFA) 108 (90 Base) MCG/ACT inhaler Inhale 2 puffs into the lungs every 6 (six) hours as needed for shortness of breath.    [provider]  azithromycin (ZITHROMAX Z-PAK) 250 MG tablet Take 1 tablet (250 mg total) by mouth daily. 10/08/19   Charlann Lange, PA-C  benzonatate (TESSALON) 100 MG capsule Take 1 capsule (100 mg total) by mouth every 8 (eight) hours. 10/06/19   Domenic Moras, PA-C  budesonide-formoterol [facility]) 160-4.5 MCG/ACT inhaler Inhale 2 puffs into the lungs 2 (two) times daily. 10/08/19   Charlann Lange, PA-C  Insulin Glargine (BASAGLAR KWIKPEN) 100 UNIT/ML SOPN Inject 0.2 mLs (20 Units total) into the skin 2 (two) times daily. 10/08/19   Charlann Lange, PA-C  insulin lispro (HUMALOG) 100 UNIT/ML injection Per your known sliding scale 10/08/19   Charlann Lange, PA-C  oxyCODONE-acetaminophen (PERCOCET) 10-325 MG tablet Take 1 tablet by mouth every 4 (four) hours as needed for pain.    [provider]  predniSONE (DELTASONE) 20 MG tablet Take 2 tablets (40 mg total) by mouth daily for 4 days. 10/10/19 10/14/19  Volanda Napoleon, PA-C  promethazine-dextromethorphan (PROMETHAZINE-DM) 6.25-15  MG/5ML syrup Take 2.5 mLs by mouth 4 (four) times daily as needed for cough. 10/10/19   Maxwell Caul, PA-C    Allergies    Ciprofloxacin  Review of Systems   Review of Systems  Constitutional: Negative for fever.  Respiratory: Positive for cough and shortness of breath.   Cardiovascular: Negative for chest pain.  Gastrointestinal: Negative for abdominal pain, nausea and vomiting.  Genitourinary: Negative for dysuria.  Neurological: Negative for headaches.  All other systems reviewed and are negative.   Physical Exam Updated Vital Signs BP  134/78   Pulse (!) 108   Temp 99.8 F (37.7 C) (Oral)   Resp (!) 21   SpO2 94%   Physical Exam Vitals and nursing note reviewed.  Constitutional:      Appearance: Normal appearance. He is well-developed.     Comments: Intermittently coughing.  HENT:     Head: Normocephalic and atraumatic.  Eyes:     General: Lids are normal.     Conjunctiva/sclera: Conjunctivae normal.     Pupils: Pupils are equal, round, and reactive to light.  Cardiovascular:     Rate and Rhythm: Regular rhythm. Tachycardia present.     Pulses: Normal pulses.     Heart sounds: Normal heart sounds. No murmur. No friction rub. No gallop.   Pulmonary:     Effort: Pulmonary effort is normal. Tachypnea present.     Breath sounds: Normal breath sounds.     Comments: Currently coughing which limits exam but no obvious wheezing.  He does have some mild tachypnea. Abdominal:     Palpations: Abdomen is soft. Abdomen is not rigid.     Tenderness: There is no abdominal tenderness. There is no guarding.     Comments: Abdomen is soft, non-distended, non-tender. No rigidity, No guarding. No peritoneal signs.  Musculoskeletal:        General: Normal range of motion.     Cervical back: Full passive range of motion without pain.  Skin:    General: Skin is warm and dry.     Capillary Refill: Capillary refill takes less than 2 seconds.  Neurological:     Mental Status: He is alert and oriented to person, place, and time.  Psychiatric:        Speech: Speech normal.     ED Results / Procedures / Treatments   Labs (all labs ordered are listed, but only abnormal results are displayed) Labs Reviewed  BASIC METABOLIC PANEL - Abnormal; Notable for the following components:      Result Value   Sodium 132 (*)    Chloride 94 (*)    Glucose, Bld 397 (*)    BUN 23 (*)    Creatinine, Ser 1.90 (*)    GFR calc non Af Amer 40 (*)    GFR calc Af Amer 47 (*)    All other components within normal limits  CBC WITH  DIFFERENTIAL/PLATELET - Abnormal; Notable for the following components:   WBC 12.8 (*)    nRBC 0.5 (*)    Monocytes Absolute 1.7 (*)    All other components within normal limits  TROPONIN I (HIGH SENSITIVITY)  TROPONIN I (HIGH SENSITIVITY)    EKG EKG Interpretation  Date/Time:  Tuesday October 10 2019 12:15:00 EST Ventricular Rate:  125 PR Interval:    QRS Duration: 89 QT Interval:  306 QTC Calculation: 440 R Axis:   18 Text Interpretation: Ectopic atrial tachycardia, unifocal Paired ventricular premature complexes Aberrant conduction of SV complex(es) Consider  right atrial enlargement Confirmed by Virgina Norfolk [phone]) on 10/10/2019 12:44:34 PM   Radiology DG Chest Portable 1 View  Result Date: 10/10/2019 CLINICAL DATA:  Cough, dyspnea, COVID-19 [place] EXAM: PORTABLE CHEST 1 VIEW COMPARISON:  10/07/2019 chest radiograph. FINDINGS: Low lung volumes. Stable cardiomediastinal silhouette with normal heart size. No pneumothorax. No pleural effusion. Mild hazy opacities at the lung bases bilaterally, slightly increased. IMPRESSION: Low lung volumes with slightly increased nonspecific mild hazy bibasilar lung opacities, which could represent atelectasis or viral pneumonia. Electronically Signed   By: Delbert Phenix M.D.   On: 10/10/2019 11:47    Procedures Procedures (including critical care time)  Medications Ordered in ED Medications  dexamethasone (DECADRON) injection 10 mg (10 mg Intravenous Given 10/10/19 1237)  albuterol (VENTOLIN HFA) 108 (90 Base) MCG/ACT inhaler 8 puff (8 puffs Inhalation Given 10/10/19 1218)  sodium chloride 0.9 % bolus 1,000 mL (0 mLs Intravenous Stopped 10/10/19 1423)  acetaminophen (TYLENOL) tablet 1,000 mg (1,000 mg Oral Given 10/10/19 1230)  sodium chloride 0.9 % bolus 1,000 mL (0 mLs Intravenous Stopped 10/10/19 1647)    ED Course  I have reviewed the triage vital signs and the nursing notes.  Pertinent labs & imaging results that were available during my  care of the patient were reviewed by me and considered in my medical decision making (see chart for details).    MDM Rules/Calculators/A&P                      49 year old male with past medical history of diabetes, hypertension, asthma who presents for evaluation of cough, shortness of breath.  Recent Covid diagnosis.  States that over the last 24 hours, his coughing has worsened which he feels like is contributing to his shortness of breath.  He states that he feels like his chest and his abdomen is sore from all the coughing.  He has not had any fevers. Initially arrival, he is afebrile but is tachycardic. Vitals otherwise stable.  He has no hypoxia.  His shortness of breath is mostly with coughing and on exam, he is intermittently coughing.  He states that when he is at rest and not coughing, shortness of breath is very minimal.  Additionally, he feels like his chest pain is more soreness from coughing.  I suspect this is all secondary infectious process from his Covid infection.  He has no PE risk factors.  We will plan to give fluids, Decadron and check a chest x-ray.  Chest x-ray reviewed.  He has possible early infiltrates which would be reflective of Covid pneumonia.  Additionally, his repeat vitals show he is now febrile.  This is likely causing his tachycardia.  Plan for Tylenol, fluids.  CBC with slight leukocytosis of 12.8.  Otherwise unremarkable.  BMP shows BUN of 23, creatinine of 1.90.  Patient given additional liter of fluids.  Reevaluation.  He does not have any wheezing or any concerning lung sounds.  His coughing has improved and his tachycardia is improving but he still slightly tachycardic.  Patient states he feels better but that he is just tired and he would like to go home.  We will plan to walk him and see what his oxygen saturation does.  Patient was able to ambulate with O2 sats between 93-97% on room air.  He still slightly tachycardic but much improved from when he  first came in.  Patient states that he feels like his symptoms are secondary to coughing and is requesting cough medication.  I discussed with him that we could continue to monitoring him in the emergency department to improve his heart rate.  He does not wish to have any further treatment here in the emergency department.  He states that he feels like this is related to his cough and if he gets his cough under control, he will feel better.  I discussed further treatment options here in the emergency department.  We engaged in shared decision making.  At this time, patient would like to go home and continue monitoring his symptoms.  He will return if he has any other symptoms.  At this time, he has no hypoxia.  He has no PE risk factors.  I suspect the tachycardia is all likely reactive secondary infectious process. At this time, patient exhibits no emergent life-threatening condition that require further evaluation in ED or admission. Patient had ample opportunity for questions and discussion. All patient's questions were answered with full understanding.   Portions of this note were generated with Scientist, clinical (histocompatibility and immunogenetics). Dictation errors may occur despite best attempts at proofreading.  Final Clinical Impression(s) / ED Diagnoses Final diagnoses:  COVID-19  Fever, unspecified fever cause  Cough  Shortness of breath    Rx / DC Orders ED Discharge Orders         Ordered    predniSONE (DELTASONE) 20 MG tablet  Daily     10/10/19 1634    promethazine-dextromethorphan (PROMETHAZINE-DM) 6.25-15 MG/5ML syrup  4 times daily PRN     10/10/19 1634           Maxwell Caul, PA-C 10/10/19 2118    Virgina Norfolk, DO 10/14/19 1539

## 2019-10-14 ENCOUNTER — Other Ambulatory Visit: Payer: Self-pay

## 2019-10-14 ENCOUNTER — Emergency Department (HOSPITAL_COMMUNITY): Payer: Medicaid - Out of State

## 2019-10-14 ENCOUNTER — Inpatient Hospital Stay (HOSPITAL_COMMUNITY)
Admission: EM | Admit: 2019-10-14 | Discharge: 2019-10-23 | DRG: 177 | Disposition: A | Discharge status: Home / Self Care | Payer: Medicaid - Out of State | Attending: Internal Medicine | Admitting: Internal Medicine

## 2019-10-14 ENCOUNTER — Encounter (HOSPITAL_COMMUNITY): Payer: Self-pay

## 2019-10-14 DIAGNOSIS — E1110 Type 2 diabetes mellitus with ketoacidosis without coma: Secondary | ICD-10-CM | POA: Diagnosis present

## 2019-10-14 DIAGNOSIS — D72829 Elevated white blood cell count, unspecified: Secondary | ICD-10-CM | POA: Diagnosis not present

## 2019-10-14 DIAGNOSIS — R0602 Shortness of breath: Secondary | ICD-10-CM | POA: Diagnosis present

## 2019-10-14 DIAGNOSIS — E8729 Other acidosis: Secondary | ICD-10-CM | POA: Diagnosis present

## 2019-10-14 DIAGNOSIS — J1282 Pneumonia due to coronavirus disease 2019: Secondary | ICD-10-CM | POA: Diagnosis present

## 2019-10-14 DIAGNOSIS — Z79891 Long term (current) use of opiate analgesic: Secondary | ICD-10-CM

## 2019-10-14 DIAGNOSIS — J9601 Acute respiratory failure with hypoxia: Secondary | ICD-10-CM | POA: Diagnosis not present

## 2019-10-14 DIAGNOSIS — Z881 Allergy status to other antibiotic agents status: Secondary | ICD-10-CM

## 2019-10-14 DIAGNOSIS — D6859 Other primary thrombophilia: Secondary | ICD-10-CM | POA: Diagnosis present

## 2019-10-14 DIAGNOSIS — E871 Hypo-osmolality and hyponatremia: Secondary | ICD-10-CM | POA: Diagnosis present

## 2019-10-14 DIAGNOSIS — Z95828 Presence of other vascular implants and grafts: Secondary | ICD-10-CM

## 2019-10-14 DIAGNOSIS — Z9081 Acquired absence of spleen: Secondary | ICD-10-CM | POA: Diagnosis not present

## 2019-10-14 DIAGNOSIS — I493 Ventricular premature depolarization: Secondary | ICD-10-CM | POA: Diagnosis present

## 2019-10-14 DIAGNOSIS — Z79899 Other long term (current) drug therapy: Secondary | ICD-10-CM | POA: Diagnosis not present

## 2019-10-14 DIAGNOSIS — T380X5A Adverse effect of glucocorticoids and synthetic analogues, initial encounter: Secondary | ICD-10-CM | POA: Diagnosis not present

## 2019-10-14 DIAGNOSIS — I10 Essential (primary) hypertension: Secondary | ICD-10-CM | POA: Diagnosis present

## 2019-10-14 DIAGNOSIS — J45909 Unspecified asthma, uncomplicated: Secondary | ICD-10-CM | POA: Diagnosis present

## 2019-10-14 DIAGNOSIS — N179 Acute kidney failure, unspecified: Secondary | ICD-10-CM | POA: Diagnosis present

## 2019-10-14 DIAGNOSIS — G8929 Other chronic pain: Secondary | ICD-10-CM | POA: Diagnosis present

## 2019-10-14 DIAGNOSIS — J9621 Acute and chronic respiratory failure with hypoxia: Secondary | ICD-10-CM | POA: Diagnosis present

## 2019-10-14 DIAGNOSIS — E872 Acidosis: Secondary | ICD-10-CM | POA: Diagnosis present

## 2019-10-14 DIAGNOSIS — T383X6A Underdosing of insulin and oral hypoglycemic [antidiabetic] drugs, initial encounter: Secondary | ICD-10-CM | POA: Diagnosis present

## 2019-10-14 DIAGNOSIS — Z91128 Patient's intentional underdosing of medication regimen for other reason: Secondary | ICD-10-CM | POA: Diagnosis not present

## 2019-10-14 DIAGNOSIS — U071 COVID-19: Principal | ICD-10-CM

## 2019-10-14 DIAGNOSIS — Z6841 Body Mass Index (BMI) 40.0 and over, adult: Secondary | ICD-10-CM | POA: Diagnosis not present

## 2019-10-14 DIAGNOSIS — Z794 Long term (current) use of insulin: Secondary | ICD-10-CM | POA: Diagnosis not present

## 2019-10-14 DIAGNOSIS — E119 Type 2 diabetes mellitus without complications: Secondary | ICD-10-CM | POA: Diagnosis not present

## 2019-10-14 DIAGNOSIS — M549 Dorsalgia, unspecified: Secondary | ICD-10-CM | POA: Diagnosis present

## 2019-10-14 LAB — CBC WITH DIFFERENTIAL/PLATELET
Abs Immature Granulocytes: 0.24 10*3/uL — ABNORMAL HIGH (ref 0.00–0.07)
Basophils Absolute: 0.1 10*3/uL (ref 0.0–0.1)
Basophils Relative: 0 %
Eosinophils Absolute: 0 10*3/uL (ref 0.0–0.5)
Eosinophils Relative: 0 %
HCT: 48.7 % (ref 39.0–52.0)
Hemoglobin: 16 g/dL (ref 13.0–17.0)
Immature Granulocytes: 1 %
Lymphocytes Relative: 11 %
Lymphs Abs: 1.9 10*3/uL (ref 0.7–4.0)
MCH: 31.4 pg (ref 26.0–34.0)
MCHC: 32.9 g/dL (ref 30.0–36.0)
MCV: 95.5 fL (ref 80.0–100.0)
Monocytes Absolute: 2.2 10*3/uL — ABNORMAL HIGH (ref 0.1–1.0)
Monocytes Relative: 13 %
Neutro Abs: 12.6 10*3/uL — ABNORMAL HIGH (ref 1.7–7.7)
Neutrophils Relative %: 75 %
Platelets: 358 10*3/uL (ref 150–400)
RBC: 5.1 MIL/uL (ref 4.22–5.81)
RDW: 13.9 % (ref 11.5–15.5)
WBC: 17 10*3/uL — ABNORMAL HIGH (ref 4.0–10.5)
nRBC: 0.3 % — ABNORMAL HIGH (ref 0.0–0.2)

## 2019-10-14 LAB — BASIC METABOLIC PANEL
Anion gap: 13 (ref 5–15)
BUN: 27 mg/dL — ABNORMAL HIGH (ref 6–20)
CO2: 22 mmol/L (ref 22–32)
Calcium: 8.9 mg/dL (ref 8.9–10.3)
Chloride: 101 mmol/L (ref 98–111)
Creatinine, Ser: 1.5 mg/dL — ABNORMAL HIGH (ref 0.61–1.24)
GFR calc Af Amer: >60 mL/min (ref >60)
GFR calc non Af Amer: 54 mL/min — ABNORMAL LOW (ref >60)
Glucose, Bld: 311 mg/dL — ABNORMAL HIGH (ref 70–99)
Potassium: 4.9 mmol/L (ref 3.5–5.1)
Sodium: 136 mmol/L (ref 135–145)

## 2019-10-14 LAB — CBG MONITORING, ED
Glucose-Capillary: 387 mg/dL — ABNORMAL HIGH (ref 70–99)
Glucose-Capillary: 393 mg/dL — ABNORMAL HIGH (ref 70–99)
Glucose-Capillary: 414 mg/dL — ABNORMAL HIGH (ref 70–99)
Glucose-Capillary: 352 mg/dL — ABNORMAL HIGH (ref 70–99)
Glucose-Capillary: 297 mg/dL — ABNORMAL HIGH (ref 70–99)
Glucose-Capillary: 272 mg/dL — ABNORMAL HIGH (ref 70–99)

## 2019-10-14 LAB — URINALYSIS, ROUTINE W REFLEX MICROSCOPIC
Bacteria, UA: NONE SEEN
Bilirubin Urine: NEGATIVE
Glucose, UA: >=500 mg/dL — AB
Hgb urine dipstick: NEGATIVE
Ketones, ur: 80 mg/dL — AB
Leukocytes,Ua: NEGATIVE
Nitrite: NEGATIVE
Protein, ur: 30 mg/dL — AB
Specific Gravity, Urine: 1.027 (ref 1.005–1.030)
pH: 5 (ref 5.0–8.0)

## 2019-10-14 LAB — COMPREHENSIVE METABOLIC PANEL
ALT: 22 U/L (ref 0–44)
AST: 24 U/L (ref 15–41)
Albumin: 3 g/dL — ABNORMAL LOW (ref 3.5–5.0)
Alkaline Phosphatase: 72 U/L (ref 38–126)
Anion gap: 16 — ABNORMAL HIGH (ref 5–15)
BUN: 27 mg/dL — ABNORMAL HIGH (ref 6–20)
CO2: 25 mmol/L (ref 22–32)
Calcium: 9.3 mg/dL (ref 8.9–10.3)
Chloride: 92 mmol/L — ABNORMAL LOW (ref 98–111)
Creatinine, Ser: 1.63 mg/dL — ABNORMAL HIGH (ref 0.61–1.24)
GFR calc Af Amer: 56 mL/min — ABNORMAL LOW (ref >60)
GFR calc non Af Amer: 49 mL/min — ABNORMAL LOW (ref >60)
Glucose, Bld: 416 mg/dL — ABNORMAL HIGH (ref 70–99)
Potassium: 4.9 mmol/L (ref 3.5–5.1)
Sodium: 133 mmol/L — ABNORMAL LOW (ref 135–145)
Total Bilirubin: 1 mg/dL (ref 0.3–1.2)
Total Protein: 8.9 g/dL — ABNORMAL HIGH (ref 6.5–8.1)

## 2019-10-14 LAB — BLOOD GAS, VENOUS
Acid-base deficit: 5.4 mmol/L — ABNORMAL HIGH (ref 0.0–2.0)
Bicarbonate: 20.5 mmol/L (ref 20.0–28.0)
O2 Saturation: 87.6 %
Patient temperature: 98.6
pCO2, Ven: 43.4 mmHg — ABNORMAL LOW (ref 44.0–60.0)
pH, Ven: 7.296 (ref 7.250–7.430)
pO2, Ven: 58.4 mmHg — ABNORMAL HIGH (ref 32.0–45.0)

## 2019-10-14 LAB — FIBRINOGEN: Fibrinogen: >800 mg/dL — ABNORMAL HIGH (ref 210–475)

## 2019-10-14 LAB — D-DIMER, QUANTITATIVE: D-Dimer, Quant: 0.74 ug/mL-FEU — ABNORMAL HIGH (ref 0.00–0.50)

## 2019-10-14 LAB — BETA-HYDROXYBUTYRIC ACID: Beta-Hydroxybutyric Acid: 2.41 mmol/L — ABNORMAL HIGH (ref 0.05–0.27)

## 2019-10-14 LAB — C-REACTIVE PROTEIN: CRP: 36.8 mg/dL — ABNORMAL HIGH (ref <1.0)

## 2019-10-14 LAB — LACTATE DEHYDROGENASE: LDH: 206 U/L — ABNORMAL HIGH (ref 98–192)

## 2019-10-14 LAB — PROCALCITONIN: Procalcitonin: 0.44 ng/mL

## 2019-10-14 LAB — TRIGLYCERIDES: Triglycerides: 349 mg/dL — ABNORMAL HIGH (ref <150)

## 2019-10-14 LAB — FERRITIN: Ferritin: 1361 ng/mL — ABNORMAL HIGH (ref 24–336)

## 2019-10-14 LAB — LACTIC ACID, PLASMA: Lactic Acid, Venous: 1.9 mmol/L (ref 0.5–1.9)

## 2019-10-14 MED ORDER — SODIUM CHLORIDE 0.9 % IV SOLN
200.0000 mg | Freq: Once | INTRAVENOUS | Status: AC
  Administered 2019-10-14: 200 mg via INTRAVENOUS
  Filled 2019-10-14: qty 200

## 2019-10-14 MED ORDER — ALBUTEROL SULFATE HFA 108 (90 BASE) MCG/ACT IN AERS
6.0000 | INHALATION_SPRAY | Freq: Once | RESPIRATORY_TRACT | Status: AC
  Administered 2019-10-14: 6 via RESPIRATORY_TRACT
  Filled 2019-10-14: qty 6.7

## 2019-10-14 MED ORDER — DEXTROSE 50 % IV SOLN: 0.0000 mL | INTRAVENOUS | Status: DC | PRN

## 2019-10-14 MED ORDER — LORAZEPAM 2 MG/ML IJ SOLN
1.0000 mg | Freq: Once | INTRAMUSCULAR | Status: AC
  Administered 2019-10-14: 1 mg via INTRAVENOUS
  Filled 2019-10-14: qty 1

## 2019-10-14 MED ORDER — SODIUM CHLORIDE 0.9 % IV SOLN
100.0000 mg | Freq: Every day | INTRAVENOUS | Status: AC
  Administered 2019-10-15 – 2019-10-17 (×3): 100 mg via INTRAVENOUS
  Filled 2019-10-14 (×4): qty 20

## 2019-10-14 MED ORDER — METHYLPREDNISOLONE SODIUM SUCC 125 MG IJ SOLR
125.0000 mg | Freq: Once | INTRAMUSCULAR | Status: AC
  Administered 2019-10-14: 125 mg via INTRAVENOUS
  Filled 2019-10-14: qty 2

## 2019-10-14 MED ORDER — SODIUM CHLORIDE 0.9 % IV SOLN: INTRAVENOUS | Status: DC

## 2019-10-14 MED ORDER — MORPHINE SULFATE (PF) 4 MG/ML IV SOLN
4.0000 mg | Freq: Once | INTRAVENOUS | Status: AC
  Administered 2019-10-14: 4 mg via INTRAVENOUS
  Filled 2019-10-14: qty 1

## 2019-10-14 MED ORDER — DEXTROSE-NACL 5-0.45 % IV SOLN: INTRAVENOUS | Status: DC

## 2019-10-14 MED ORDER — SODIUM CHLORIDE 0.9 % IV BOLUS
1000.0000 mL | INTRAVENOUS | Status: AC
  Administered 2019-10-14 (×2): 1000 mL via INTRAVENOUS

## 2019-10-14 MED ORDER — POTASSIUM CHLORIDE 10 MEQ/100ML IV SOLN
10.0000 meq | INTRAVENOUS | Status: AC
  Administered 2019-10-14: 10 meq via INTRAVENOUS
  Filled 2019-10-14: qty 100

## 2019-10-14 MED ORDER — INSULIN REGULAR(HUMAN) IN NACL 100-0.9 UT/100ML-% IV SOLN
INTRAVENOUS | Status: DC
  Administered 2019-10-14: 17 [IU]/h via INTRAVENOUS
  Administered 2019-10-14: 22 [IU]/h via INTRAVENOUS
  Administered 2019-10-15: 13 [IU]/h via INTRAVENOUS
  Administered 2019-10-15: 18 [IU]/h via INTRAVENOUS
  Administered 2019-10-15: 10 [IU]/h via INTRAVENOUS
  Administered 2019-10-15: 24 [IU]/h via INTRAVENOUS
  Administered 2019-10-15: 23 [IU]/h via INTRAVENOUS
  Administered 2019-10-15: 18 [IU]/h via INTRAVENOUS
  Administered 2019-10-15 (×2): 30 [IU]/h via INTRAVENOUS
  Administered 2019-10-15: 26 [IU]/h via INTRAVENOUS
  Administered 2019-10-15 (×2): 29 [IU]/h via INTRAVENOUS
  Administered 2019-10-15: 15 [IU]/h via INTRAVENOUS
  Administered 2019-10-15: 16 [IU]/h via INTRAVENOUS
  Administered 2019-10-15: 10 [IU]/h via INTRAVENOUS
  Filled 2019-10-14 (×7): qty 100

## 2019-10-14 NOTE — ED Notes (Signed)
[facility] called paperwork in shelf at nurse desk. Report given to [facility]

## 2019-10-14 NOTE — ED Notes (Signed)
K 4.9 Per MD [place] hold second bag of K

## 2019-10-14 NOTE — ED Notes (Signed)
IV team at bedside 

## 2019-10-14 NOTE — ED Provider Notes (Signed)
[facility]-EMERGENCY DEPT Provider Note   CSN: [id number] Arrival date & time: 10/14/19  1313     History Chief Complaint  Patient presents with  . COVID POSITIVE  . Asthma    Jesse Washington is a 49 y.o. male.  The history is provided by the patient, medical records and the EMS personnel. No language interpreter was used.  Asthma     49 year old male with history of hypertension, asthma, diabetes, recently diagnosed with COVID-19 infection on 10/06/2019 brought here via EMS from home for evaluation of shortness of breath.  History is limited as patient is in moderate respiratory discomfort.  Patient endorsed worsening shortness of breath even with slight exertion.  Medication at home has not helped.  Decrease in appetite not eating drinking much.  Endorsed increased wheezing.  No report of loss of taste or smell.  Coughing seems to aggravate his symptoms.  Medication at home has not helped.  Level 5 caveat is due to respiratory discomfort.  EMS placed patient on 15 L of supplemental O2 on route.  Past Medical History:  Diagnosis Date  . Diabetes mellitus without complication (HCC)   . Hypertension     There are no problems to display for this patient.   No past surgical history on file.     No family history on file.  Social History   Tobacco Use  . Smoking status: Not on file  Substance Use Topics  . Alcohol use: Not on file  . Drug use: Not on file    Home Medications Prior to Admission medications   Medication Sig Start Date End Date Taking? Authorizing Provider  acetaminophen (TYLENOL) 500 MG tablet Take 1 tablet (500 mg total) by mouth every 6 (six) hours as needed. 10/06/19   Fayrene Helper, PA-C  albuterol (VENTOLIN HFA) 108 (90 Base) MCG/ACT inhaler Inhale 2 puffs into the lungs every 6 (six) hours as needed for shortness of breath.    [provider]  azithromycin (ZITHROMAX Z-PAK) 250 MG tablet Take 1 tablet (250 mg total) by  mouth daily. 10/08/19   Elpidio Anis, PA-C  benzonatate (TESSALON) 100 MG capsule Take 1 capsule (100 mg total) by mouth every 8 (eight) hours. 10/06/19   Fayrene Helper, PA-C  budesonide-formoterol [facility]) 160-4.5 MCG/ACT inhaler Inhale 2 puffs into the lungs 2 (two) times daily. 10/08/19   Elpidio Anis, PA-C  Insulin Glargine (BASAGLAR KWIKPEN) 100 UNIT/ML SOPN Inject 0.2 mLs (20 Units total) into the skin 2 (two) times daily. 10/08/19   Elpidio Anis, PA-C  insulin lispro (HUMALOG) 100 UNIT/ML injection Per your known sliding scale 10/08/19   Elpidio Anis, PA-C  oxyCODONE-acetaminophen (PERCOCET) 10-325 MG tablet Take 1 tablet by mouth every 4 (four) hours as needed for pain.    [provider]  predniSONE (DELTASONE) 20 MG tablet Take 2 tablets (40 mg total) by mouth daily for 4 days. 10/10/19 10/14/19  Maxwell Caul, PA-C  promethazine-dextromethorphan (PROMETHAZINE-DM) 6.25-15 MG/5ML syrup Take 2.5 mLs by mouth 4 (four) times daily as needed for cough. 10/10/19   Maxwell Caul, PA-C    Allergies    Ciprofloxacin  Review of Systems   Review of Systems  Unable to perform ROS: Severe respiratory distress  All other systems reviewed and are negative.   Physical Exam Updated Vital Signs BP (!) 144/69   Pulse (!) 113   Temp 99.3 F (37.4 C) (Oral)   Resp (!) 40   SpO2 94%   Physical Exam  Vitals and nursing note reviewed.  Constitutional:      Appearance: He is well-developed.     Comments: Patient is morbidly obese, in moderate respiratory discomfort.  HENT:     Head: Atraumatic.  Eyes:     Conjunctiva/sclera: Conjunctivae normal.  Cardiovascular:     Rate and Rhythm: Tachycardia present.     Pulses: Normal pulses.     Heart sounds: Normal heart sounds.  Pulmonary:     Effort: Respiratory distress present.     Breath sounds: Rales present.     Comments: Decreased breath sounds, tachypneic, tachycardic, crackles heard on lung bases without any obvious  wheezes. Abdominal:     Palpations: Abdomen is soft.  Musculoskeletal:        General: No swelling.     Cervical back: Neck supple.  Skin:    Findings: No rash.  Neurological:     Mental Status: He is alert. Mental status is at baseline.     ED Results / Procedures / Treatments   Labs (all labs ordered are listed, but only abnormal results are displayed) Labs Reviewed  CBC WITH DIFFERENTIAL/PLATELET - Abnormal; Notable for the following components:      Result Value   WBC 17.0 (*)    nRBC 0.3 (*)    Neutro Abs 12.6 (*)    Monocytes Absolute 2.2 (*)    Abs Immature Granulocytes 0.24 (*)    All other components within normal limits  COMPREHENSIVE METABOLIC PANEL - Abnormal; Notable for the following components:   Sodium 133 (*)    Chloride 92 (*)    Glucose, Bld 416 (*)    BUN 27 (*)    Creatinine, Ser 1.63 (*)    Total Protein 8.9 (*)    Albumin 3.0 (*)    GFR calc non Af Amer 49 (*)    GFR calc Af Amer 56 (*)    Anion gap 16 (*)    All other components within normal limits  D-DIMER, QUANTITATIVE (NOT AT [facility]) - Abnormal; Notable for the following components:   D-Dimer, Quant 0.74 (*)    All other components within normal limits  LACTATE DEHYDROGENASE - Abnormal; Notable for the following components:   LDH 206 (*)    All other components within normal limits  FERRITIN - Abnormal; Notable for the following components:   Ferritin 1,361 (*)    All other components within normal limits  TRIGLYCERIDES - Abnormal; Notable for the following components:   Triglycerides 349 (*)    All other components within normal limits  FIBRINOGEN - Abnormal; Notable for the following components:   Fibrinogen >800 (*)    All other components within normal limits  C-REACTIVE PROTEIN - Abnormal; Notable for the following components:   CRP 36.8 (*)    All other components within normal limits  CULTURE, BLOOD (ROUTINE X 2)  CULTURE, BLOOD (ROUTINE X 2)  LACTIC ACID, PLASMA    PROCALCITONIN  LACTIC ACID, PLASMA  BASIC METABOLIC PANEL  BASIC METABOLIC PANEL  BASIC METABOLIC PANEL  BASIC METABOLIC PANEL  BETA-HYDROXYBUTYRIC ACID  BETA-HYDROXYBUTYRIC ACID  URINALYSIS, ROUTINE W REFLEX MICROSCOPIC  BLOOD GAS, VENOUS  CBG MONITORING, ED    EKG EKG Interpretation  Date/Time:  Saturday October 14 2019 14:34:20 EST Ventricular Rate:  110 PR Interval:    QRS Duration: 84 QT Interval:  320 QTC Calculation: 433 R Axis:   43 Text Interpretation: Sinus tachycardia Ventricular premature complex Aberrant conduction of SV complex(es) Low voltage, precordial leads  ST elevation, consider inferior injury No significant change since last tracing Confirmed by Linwood Dibbles [phone]) on 10/14/2019 2:38:20 PM   Radiology DG Chest Port 1 View  Result Date: 10/14/2019 CLINICAL DATA:  Shortness of breath. COVID positive. EXAM: PORTABLE CHEST 1 VIEW COMPARISON:  Chest x-ray dated 10/10/2019. FINDINGS: Bibasilar opacities are likely increased. Suspected small LEFT pleural effusion. No pneumothorax seen. Heart size and mediastinal contours appear stable. IMPRESSION: 1. Suspected worsening bibasilar pneumonia. 2. Probable small LEFT pleural effusion. Electronically Signed   By: Bary Richard M.D.   On: 10/14/2019 13:43    Procedures .Critical Care Performed by: Fayrene Helper, PA-C Authorized by: Fayrene Helper, PA-C   Critical care provider statement:    Critical care time (minutes):  70   Critical care was time spent personally by me on the following activities:  Discussions with consultants, evaluation of patient's response to treatment, examination of patient, ordering and performing treatments and interventions, ordering and review of laboratory studies, ordering and review of radiographic studies, pulse oximetry, re-evaluation of patient's condition, obtaining history from patient or surrogate and review of old charts   (including critical care time)  Medications Ordered in  ED Medications  0.9 %  sodium chloride infusion ( Intravenous New Bag/Given 10/14/19 1458)  insulin regular, human (MYXREDLIN) 100 units/ 100 mL infusion (has no administration in time range)  0.9 %  sodium chloride infusion (has no administration in time range)  dextrose 5 %-0.45 % sodium chloride infusion (has no administration in time range)  dextrose 50 % solution 0-50 mL (has no administration in time range)  sodium chloride 0.9 % bolus 1,000 mL (has no administration in time range)  potassium chloride 10 mEq in 100 mL IVPB (has no administration in time range)  albuterol (VENTOLIN HFA) 108 (90 Base) MCG/ACT inhaler 6 puff (6 puffs Inhalation Given 10/14/19 1446)  methylPREDNISolone sodium succinate (SOLU-MEDROL) 125 mg/2 mL injection 125 mg (125 mg Intravenous Given 10/14/19 1458)  LORazepam (ATIVAN) injection 1 mg (1 mg Intravenous Given 10/14/19 1552)  morphine 4 MG/ML injection 4 mg (4 mg Intravenous Given 10/14/19 1552)    ED Course  I have reviewed the triage vital signs and the nursing notes.  Pertinent labs & imaging results that were available during my care of the patient were reviewed by me and considered in my medical decision making (see chart for details).    MDM Rules/Calculators/A&P                      BP (!) 144/69   Pulse (!) 113   Temp 99.3 F (37.4 C) (Oral)   Resp (!) 40   SpO2 94%   Final Clinical Impression(s) / ED Diagnoses Final diagnoses:  Acute hypoxemic respiratory failure due to COVID-19 [facility])  Diabetic ketoacidosis without coma associated with type 2 diabetes mellitus (HCC)    Rx / DC Orders ED Discharge Orders    None     2:06 PM Patient was diagnosed with COVID-19 a bit over a week ago here with progressive worsening shortness of breath.  He was brought in at 15 L of supplemental oxygen in which we decreased to 4 L while maintaining 94% on 4 L.  An attempt to check his baseline O2, we trends down the supplemental oxygen and patient  immediately desats down to 88% on room air.  Patient now receiving 4 L at 95%.  Repeat chest x-ray today shows worsening bibasilar pneumonia with probable small left pleural effusion.  Patient will need to be admitted for respiratory distress secondary to underlying Covid infection.  Albuterol inhaler given.  At this time, I do not think patient would require intubation for the moment.  We will monitor closely.  4:14 PM After receiving treatment, patient felt a bit better.  Labs remarkable elevated white count of 17Likely due to recent steroid use, coupled with underlying infection.  Patient is hyperglycemic with a CBG of 416, anion gap of 16, and evidence of ketones in urine consistent with DKA.  Will initiate glucose stabilizer.  Evidence of AKI as well.  Inflammatory markers are elevated consistent with underlying COVID-19 infection.  Appreciate consultation from [facility] hospitalist Dr. [place] who agrees to see and admit patient for further management of his condition.  Patient admitted for acute respiratory failure secondary to COVID-19 infection as well as DKA.  Jaquis Picklesimer Dacy was evaluated in Emergency Department on 10/14/2019 for the symptoms described in the history of present illness. He was evaluated in the context of the global COVID-19 pandemic, which necessitated consideration that the patient might be at risk for infection with the SARS-CoV-2 virus that causes COVID-19. Institutional protocols and algorithms that pertain to the evaluation of patients at risk for COVID-19 are in a state of rapid change based on information released by regulatory bodies including the CDC and federal and state organizations. These policies and algorithms were followed during the patient's care in the ED.    Domenic Moras, PA-C 10/14/19 1617    Dorie Rank, MD 10/15/19 [phone]

## 2019-10-14 NOTE — ED Triage Notes (Signed)
Pt arrived via [facility] from home CC COVID Positive X1 week and asthma exacerbation. Per EMS pt was on 15L upon arrival from [employer] [facility]    Hx Asthma

## 2019-10-14 NOTE — ED Notes (Signed)
Unable to get second set of culture due to difficult stick. Provider aware

## 2019-10-14 NOTE — ED Triage Notes (Signed)
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

## 2019-10-14 NOTE — H&P (Signed)
History and Physical    Jesse Washington:[id number] DOB: 1970/12/10 DOA: 10/14/2019  PCP: Patient, No Pcp Per  Patient coming from: Home  I have personally briefly reviewed patient's old medical records in [facility]  Chief Complaint: Shortness of breath, weakness, fatigue  HPI: Jesse Washington is a 49 y.o. male with medical history significant of type 2 diabetes mellitus, essential hypertension, asthma, morbid obesity who he presents to the ED with progressive shortness of breath over the last week.  He was recently seen on 10/10/2019, after being diagnosed with Covid-19 on 10/06/2019 with shortness of breath.  At that time he was oxygenating well on room air and discharged home on prednisone and antitussives.  Since discharge from the ED, he has had progressive symptoms and was found to be significantly hypoxic on EMS arrival, requiring 15 L per NRB.  Patient currently endorses significant shortness of breath and wheezing.  Requesting something to eat.  No other significant plans at this time.  Denies headache, no chest pain, no palpitations, no nausea/vomiting/diarrhea, no abdominal pain.  ED Course: Temperature 99.3, HR 113, RR 40, BP 144/69, SPO2 94% on 4 L nasal cannula.  WBC count 17.0, hemoglobin 16.0, platelets 358.  Sodium 133, potassium 4.9, chloride 92, CO2 25, BUN 27, creatinine 1.63, glucose 416.  Anion gap 16.  LDH 206, ferritin 1.361, CRP 36.8, lactic acid 1.9, procalcitonin 0.44, D-dimer 0.74, fibrinogen greater than 800.  Chest x-ray with bibasilar pneumonia and left pleural effusion.  Urinalysis with 80 ketones.  Positive Covid-19 on 10/06/2019.  Patient was started on insulin drip protocol for DKA, given Solu-Medrol 125 mg IV, 4 mg IV morphine in the ED.  Patient referred for admission by EDP secondary to acute hypoxic respiratory failure and DKA for further work-up and treatment.  Review of Systems: As per HPI otherwise 10 point review of systems negative.    Past  Medical History:  Diagnosis Date  . Diabetes mellitus without complication (HCC)   . Hypertension     History reviewed. No pertinent surgical history.   has no history on file for tobacco, alcohol, and drug.  Allergies  Allergen Reactions  . Ciprofloxacin Rash    History reviewed. No pertinent family history.  Family history reviewed and not pertinent   Prior to Admission medications   Medication Sig Start Date End Date Taking? Authorizing Provider  acetaminophen (TYLENOL) 500 MG tablet Take 1 tablet (500 mg total) by mouth every 6 (six) hours as needed. 10/06/19   Fayrene Helper, PA-C  albuterol (VENTOLIN HFA) 108 (90 Base) MCG/ACT inhaler Inhale 2 puffs into the lungs every 6 (six) hours as needed for shortness of breath.    [provider]  azithromycin (ZITHROMAX Z-PAK) 250 MG tablet Take 1 tablet (250 mg total) by mouth daily. 10/08/19   Elpidio Anis, PA-C  benzonatate (TESSALON) 100 MG capsule Take 1 capsule (100 mg total) by mouth every 8 (eight) hours. 10/06/19   Fayrene Helper, PA-C  budesonide-formoterol [facility]) 160-4.5 MCG/ACT inhaler Inhale 2 puffs into the lungs 2 (two) times daily. 10/08/19   Elpidio Anis, PA-C  Insulin Glargine (BASAGLAR KWIKPEN) 100 UNIT/ML SOPN Inject 0.2 mLs (20 Units total) into the skin 2 (two) times daily. 10/08/19   Elpidio Anis, PA-C  insulin lispro (HUMALOG) 100 UNIT/ML injection Per your known sliding scale 10/08/19   Elpidio Anis, PA-C  oxyCODONE-acetaminophen (PERCOCET) 10-325 MG tablet Take 1 tablet by mouth every 4 (four) hours as needed for pain.  [provider]  predniSONE (DELTASONE) 20 MG tablet Take 2 tablets (40 mg total) by mouth daily for 4 days. 10/10/19 10/14/19  Maxwell Caul, PA-C  promethazine-dextromethorphan (PROMETHAZINE-DM) 6.25-15 MG/5ML syrup Take 2.5 mLs by mouth 4 (four) times daily as needed for cough. 10/10/19   Maxwell Caul, PA-C    Physical Exam: Vitals:   10/14/19 1342 10/14/19 1430  10/14/19 1500 10/14/19 1600  BP: (!) 147/99 (!) 147/101 (!) 168/113 (!) 144/69  Pulse: (!) 121 (!) 111 (!) 117 (!) 113  Resp: (!) 22 (!) 27 (!) 38 (!) 40  Temp: 99.3 F (37.4 C)     TempSrc: Oral     SpO2: 95% 95% 96% 94%    Constitutional: Mild respiratory distress, obese Eyes: PERRL, lids and conjunctivae normal ENMT: Mucous membranes are dry. Posterior pharynx clear of any exudate or lesions.Normal dentition.  Neck: normal, supple, no masses, no thyromegaly Respiratory: Diffuse wheezing bilaterally, decreased breath sounds bilateral bases, increased work of breathing, slight accessory muscle use, oxygenating 94% on 4 L nasal cannula Cardiovascular: Tachycardic, regular rhythm, no murmurs / rubs / gallops. No extremity edema. 2+ pedal pulses. No carotid bruits.  Abdomen: no tenderness, protuberant abdomen, no masses palpated. No hepatosplenomegaly. Bowel sounds positive.  Musculoskeletal: no clubbing / cyanosis. No joint deformity upper and lower extremities. Good ROM, no contractures. Normal muscle tone.  Skin: no rashes, lesions, ulcers. No induration Neurologic: CN 2-12 grossly intact. Sensation intact, DTR normal. Strength 5/5 in all 4.  Psychiatric: Normal judgment and insight. Alert and oriented x 3. Normal mood.    Labs on Admission: I have personally reviewed following labs and imaging studies  CBC: Recent Labs  Lab 10/07/19 2204 10/07/19 2213 10/10/19 1332 10/14/19 1319  WBC 9.9  --  12.8* 17.0*  NEUTROABS 5.3  --  7.7 12.6*  HGB 16.1 16.0 16.4 16.0  HCT 49.5 47.0 48.6 48.7  MCV 94.6  --  94.0 95.5  PLT 249  --  282 358   Basic Metabolic Panel: Recent Labs  Lab 10/07/19 2204 10/07/19 2213 10/10/19 1332 10/14/19 1319  NA 130* 130* 132* 133*  K 4.3 6.5* 3.9 4.9  CL 94* 100 94* 92*  CO2 23  --  25 25  GLUCOSE 356* 347* 397* 416*  BUN 17 23* 23* 27*  CREATININE 1.63* 1.40* 1.90* 1.63*  CALCIUM 9.3  --  9.4 9.3   GFR: Estimated Creatinine Clearance: 90.6  mL/min (A) (by C-G formula based on SCr of 1.63 mg/dL (H)). Liver Function Tests: Recent Labs  Lab 10/07/19 2204 10/14/19 1319  AST 38 24  ALT 33 22  ALKPHOS 93 72  BILITOT 1.3* 1.0  PROT 8.8* 8.9*  ALBUMIN 3.9 3.0*   No results for input(s): LIPASE, AMYLASE in the last 168 hours. No results for input(s): AMMONIA in the last 168 hours. Coagulation Profile: No results for input(s): INR, PROTIME in the last 168 hours. Cardiac Enzymes: No results for input(s): CKTOTAL, CKMB, CKMBINDEX, TROPONINI in the last 168 hours. BNP (last 3 results) No results for input(s): PROBNP in the last 8760 hours. HbA1C: No results for input(s): HGBA1C in the last 72 hours. CBG: Recent Labs  Lab 10/08/19 0012 10/08/19 0555  GLUCAP 445* 470*   Lipid Profile: Recent Labs    10/14/19 1319  TRIG 349*   Thyroid Function Tests: No results for input(s): TSH, T4TOTAL, FREET4, T3FREE, THYROIDAB in the last 72 hours. Anemia Panel: Recent Labs    10/14/19 1319  FERRITIN  1,361*   Urine analysis:    Component Value Date/Time   COLORURINE YELLOW 10/07/2019 2204   APPEARANCEUR HAZY (A) 10/07/2019 2204   LABSPEC 1.032 (H) 10/07/2019 2204   PHURINE 5.0 10/07/2019 2204   GLUCOSEU >=500 (A) 10/07/2019 2204   HGBUR NEGATIVE 10/07/2019 2204   BILIRUBINUR NEGATIVE 10/07/2019 2204   KETONESUR 80 (A) 10/07/2019 2204   PROTEINUR 30 (A) 10/07/2019 2204   NITRITE NEGATIVE 10/07/2019 2204   LEUKOCYTESUR NEGATIVE 10/07/2019 2204    Radiological Exams on Admission: DG Chest Port 1 View  Result Date: 10/14/2019 CLINICAL DATA:  Shortness of breath. COVID positive. EXAM: PORTABLE CHEST 1 VIEW COMPARISON:  Chest x-ray dated 10/10/2019. FINDINGS: Bibasilar opacities are likely increased. Suspected small LEFT pleural effusion. No pneumothorax seen. Heart size and mediastinal contours appear stable. IMPRESSION: 1. Suspected worsening bibasilar pneumonia. 2. Probable small LEFT pleural effusion. Electronically  Signed   By: Bary Richard M.D.   On: 10/14/2019 13:43    EKG: Independently reviewed.   Assessment/Plan Principal Problem:   Pneumonia due to COVID-19 virus Active Problems:   DKA (diabetic ketoacidoses) (HCC)   Type 2 diabetes mellitus without complication, with long-term current use of insulin (HCC)   Hyponatremia   High anion gap metabolic acidosis   Acute on chronic respiratory failure with hypoxia (HCC)   Morbid obesity with BMI of 50.0-59.9, adult (HCC)   Asthma   Leukocytosis   Acute respiratory failure with hypoxia Acute hypoxic respiratory failure secondary to acute Covid-19 viral pneumonia during the ongoing Covid 19 Pandemic - POA Hx of Asthma Patient represented to the ED with progressive shortness of breath, cough.  Recently seen on 10/10/2019 and discharged home with prednisone and antitussives.  Positive Covid-19 on 10/06/2019.  Symptoms have been progressively worsening despite treatment.  Patient was noted to be tachycardic, tachypneic with RR in the 40s with a leukocytosis of 17.0, initially requiring 15 L NRB via EMS, now titrated down to 4 L nasal cannula.  EH 206, ferritin 1361, CRP 36.8, lactic acid 1.9, procalcitonin 0.44, D-dimer 0.74, fibrinogen greater than 800. --Admit inpatient, stepdown unit --Remdesivir, plan 5-day course --Decadron 6 mg IV daily --Combivent inhaler every 6 hours --Continue supplemental oxygen, titrate to maintain SPO2 greater than 92% --Continue supportive care with vitamin C, zinc, Tylenol --Follow CBC, CMP, D-dimer, ferritin, and CRP daily --Continue airborne/contact isolation precautions  Diabetic ketoacidosis Type 2 diabetes mellitus Glucose 416, anion gap 16, 80 ketones in urine.  This will likely further be worsened by IV steroids, as this is needed for his underlying respiratory failure from Covid-19 pneumonia as above further complicated by history of asthma. --Initiate DKA protocol with IV insulin --Check ABG and  hydroxybutyrate acid --Hemoglobin A1c --IV fluid hydration  Acute renal failure Creatinine 1.63 on admission.  Likely complicated by his poor oral intake and underlying DKA as above. --Check urine sodium, urine creatinine --IV fluid hydration --Repeat BMP in a.m.  Pseudo-hyponatremia Sodium 133 with glucose 416, corrected sodium 138. --Monitor BMP daily  Leukocytosis Etiology likely reactive, recent trial of outpatient prednisone in the setting of active Covid-19 infection. --Monitor CBC daily  Morbid obesity BMI 50.13 kg/m.  Discussed with patient needs aggressive weight loss measures and lifestyle changes as this complicates all facets of care.    DVT prophylaxis: Lovenox Code Status: Full code Family Communication: Discussed with patient extensively at bedside Disposition Plan: Anticipate discharge home when medically ready Consults called: None Admission status: Inpatient, stepdown unit   Severity of Illness: The appropriate  patient status for this patient is INPATIENT. Inpatient status is judged to be reasonable and necessary in order to provide the required intensity of service to ensure the patient's safety. The patient's presenting symptoms, physical exam findings, and initial radiographic and laboratory data in the context of their chronic comorbidities is felt to place them at high risk for further clinical deterioration. Furthermore, it is not anticipated that the patient will be medically stable for discharge from the hospital within 2 midnights of admission. The following factors support the patient status of inpatient.   " The patient's presenting symptoms include shortness of breath, cough, fatigue, weakness " The worrisome physical exam findings include tachypnea, tachycardia, hypoxia, increased work of breathing " The initial radiographic and laboratory data are worrisome because of hyponatremia, anion gap metabolic acidosis, elevated creatinine, elevated  glucose, leukocytosis, elevated inflammatory markers, 80 ketones in urine, positive Covid-19, chest x-ray with bibasilar pneumonia " The chronic co-morbidities include morbid obesity, hypertension, diabetes, asthma.   * I certify that at the point of admission it is my clinical judgment that the patient will require inpatient hospital care spanning beyond 2 midnights from the point of admission due to high intensity of service, high risk for further deterioration and high frequency of surveillance required.*    Pepe Mineau J [place] DO [facility] Available via Epic secure chat 7am-7pm After these hours, please refer to coverage provider listed on [email or web address] 10/14/2019, 4:34 PM

## 2019-10-14 NOTE — ED Notes (Addendum)
Called [facility] to follow up on pick up status. Unable to verify timeline

## 2019-10-15 ENCOUNTER — Encounter (HOSPITAL_COMMUNITY): Payer: Self-pay | Admitting: Internal Medicine

## 2019-10-15 DIAGNOSIS — Z6841 Body Mass Index (BMI) 40.0 and over, adult: Secondary | ICD-10-CM

## 2019-10-15 DIAGNOSIS — J45909 Unspecified asthma, uncomplicated: Secondary | ICD-10-CM

## 2019-10-15 DIAGNOSIS — J9621 Acute and chronic respiratory failure with hypoxia: Secondary | ICD-10-CM

## 2019-10-15 LAB — GLUCOSE, CAPILLARY
Glucose-Capillary: 154 mg/dL — ABNORMAL HIGH (ref 70–99)
Glucose-Capillary: 177 mg/dL — ABNORMAL HIGH (ref 70–99)
Glucose-Capillary: 193 mg/dL — ABNORMAL HIGH (ref 70–99)
Glucose-Capillary: 196 mg/dL — ABNORMAL HIGH (ref 70–99)
Glucose-Capillary: 205 mg/dL — ABNORMAL HIGH (ref 70–99)
Glucose-Capillary: 206 mg/dL — ABNORMAL HIGH (ref 70–99)
Glucose-Capillary: 210 mg/dL — ABNORMAL HIGH (ref 70–99)
Glucose-Capillary: 222 mg/dL — ABNORMAL HIGH (ref 70–99)
Glucose-Capillary: 247 mg/dL — ABNORMAL HIGH (ref 70–99)
Glucose-Capillary: 249 mg/dL — ABNORMAL HIGH (ref 70–99)
Glucose-Capillary: 260 mg/dL — ABNORMAL HIGH (ref 70–99)
Glucose-Capillary: 265 mg/dL — ABNORMAL HIGH (ref 70–99)
Glucose-Capillary: 270 mg/dL — ABNORMAL HIGH (ref 70–99)
Glucose-Capillary: 276 mg/dL — ABNORMAL HIGH (ref 70–99)
Glucose-Capillary: 280 mg/dL — ABNORMAL HIGH (ref 70–99)
Glucose-Capillary: 295 mg/dL — ABNORMAL HIGH (ref 70–99)
Glucose-Capillary: 420 mg/dL — ABNORMAL HIGH (ref 70–99)

## 2019-10-15 LAB — BASIC METABOLIC PANEL
Anion gap: 12 (ref 5–15)
Anion gap: 9 (ref 5–15)
BUN: 27 mg/dL — ABNORMAL HIGH (ref 6–20)
BUN: 28 mg/dL — ABNORMAL HIGH (ref 6–20)
CO2: 23 mmol/L (ref 22–32)
CO2: 24 mmol/L (ref 22–32)
Calcium: 9.1 mg/dL (ref 8.9–10.3)
Calcium: 9.4 mg/dL (ref 8.9–10.3)
Chloride: 102 mmol/L (ref 98–111)
Chloride: 105 mmol/L (ref 98–111)
Creatinine, Ser: 1.2 mg/dL (ref 0.61–1.24)
Creatinine, Ser: 1.23 mg/dL (ref 0.61–1.24)
GFR calc Af Amer: >60 mL/min (ref >60)
GFR calc Af Amer: >60 mL/min (ref >60)
GFR calc non Af Amer: >60 mL/min (ref >60)
GFR calc non Af Amer: >60 mL/min (ref >60)
Glucose, Bld: 264 mg/dL — ABNORMAL HIGH (ref 70–99)
Glucose, Bld: 204 mg/dL — ABNORMAL HIGH (ref 70–99)
Potassium: 4.3 mmol/L (ref 3.5–5.1)
Potassium: 5.2 mmol/L — ABNORMAL HIGH (ref 3.5–5.1)
Sodium: 137 mmol/L (ref 135–145)
Sodium: 138 mmol/L (ref 135–145)

## 2019-10-15 LAB — C-REACTIVE PROTEIN: CRP: 29.8 mg/dL — ABNORMAL HIGH (ref <1.0)

## 2019-10-15 LAB — HEMOGLOBIN A1C
Hgb A1c MFr Bld: 14.1 % — ABNORMAL HIGH (ref 4.8–5.6)
Mean Plasma Glucose: 357.97 mg/dL

## 2019-10-15 LAB — GLUCOSE, RANDOM: Glucose, Bld: 463 mg/dL — ABNORMAL HIGH (ref 70–99)

## 2019-10-15 LAB — BETA-HYDROXYBUTYRIC ACID
Beta-Hydroxybutyric Acid: 0.41 mmol/L — ABNORMAL HIGH (ref 0.05–0.27)
Beta-Hydroxybutyric Acid: 0.13 mmol/L (ref 0.05–0.27)

## 2019-10-15 LAB — ABO/RH: ABO/RH(D): B NEG

## 2019-10-15 LAB — FERRITIN: Ferritin: 1412 ng/mL — ABNORMAL HIGH (ref 24–336)

## 2019-10-15 LAB — D-DIMER, QUANTITATIVE: D-Dimer, Quant: 0.84 ug/mL-FEU — ABNORMAL HIGH (ref 0.00–0.50)

## 2019-10-15 MED ORDER — ACETAMINOPHEN 325 MG PO TABS
650.0000 mg | ORAL_TABLET | Freq: Four times a day (QID) | ORAL | Status: DC | PRN
  Administered 2019-10-16: 650 mg via ORAL
  Filled 2019-10-15: qty 2

## 2019-10-15 MED ORDER — ASCORBIC ACID 500 MG PO TABS
500.0000 mg | ORAL_TABLET | Freq: Every day | ORAL | Status: DC
  Administered 2019-10-15 – 2019-10-23 (×9): 500 mg via ORAL
  Filled 2019-10-15 (×9): qty 1

## 2019-10-15 MED ORDER — AZITHROMYCIN 250 MG PO TABS
500.0000 mg | ORAL_TABLET | Freq: Once | ORAL | Status: AC
  Administered 2019-10-15: 500 mg via ORAL
  Filled 2019-10-15: qty 2

## 2019-10-15 MED ORDER — ONDANSETRON HCL 4 MG/2ML IJ SOLN: 4.0000 mg | Freq: Four times a day (QID) | INTRAMUSCULAR | Status: DC | PRN

## 2019-10-15 MED ORDER — AZITHROMYCIN 250 MG PO TABS
250.0000 mg | ORAL_TABLET | Freq: Every day | ORAL | Status: AC
  Administered 2019-10-16 – 2019-10-20 (×5): 250 mg via ORAL
  Filled 2019-10-15 (×6): qty 1

## 2019-10-15 MED ORDER — OXYCODONE-ACETAMINOPHEN 10-325 MG PO TABS: 1.0000 | ORAL_TABLET | ORAL | Status: DC | PRN

## 2019-10-15 MED ORDER — INSULIN ASPART 100 UNIT/ML ~~LOC~~ SOLN
0.0000 [IU] | Freq: Three times a day (TID) | SUBCUTANEOUS | Status: DC
  Administered 2019-10-15: 4 [IU] via SUBCUTANEOUS
  Administered 2019-10-16: 20 [IU] via SUBCUTANEOUS
  Administered 2019-10-16: 15 [IU] via SUBCUTANEOUS
  Administered 2019-10-16: 20 [IU] via SUBCUTANEOUS
  Administered 2019-10-17: 15 [IU] via SUBCUTANEOUS
  Administered 2019-10-17: 11 [IU] via SUBCUTANEOUS
  Administered 2019-10-17: 7 [IU] via SUBCUTANEOUS
  Administered 2019-10-18: 4 [IU] via SUBCUTANEOUS
  Administered 2019-10-18 (×2): 7 [IU] via SUBCUTANEOUS
  Administered 2019-10-19: 15 [IU] via SUBCUTANEOUS
  Administered 2019-10-19: 3 [IU] via SUBCUTANEOUS

## 2019-10-15 MED ORDER — ONDANSETRON HCL 4 MG PO TABS: 4.0000 mg | ORAL_TABLET | Freq: Four times a day (QID) | ORAL | Status: DC | PRN

## 2019-10-15 MED ORDER — TOCILIZUMAB 400 MG/20ML IV SOLN
800.0000 mg | Freq: Once | INTRAVENOUS | Status: AC
  Administered 2019-10-15: 800 mg via INTRAVENOUS
  Filled 2019-10-15: qty 40

## 2019-10-15 MED ORDER — INSULIN ASPART 100 UNIT/ML ~~LOC~~ SOLN
20.0000 [IU] | Freq: Three times a day (TID) | SUBCUTANEOUS | Status: DC
  Administered 2019-10-16 (×3): 20 [IU] via SUBCUTANEOUS

## 2019-10-15 MED ORDER — IPRATROPIUM-ALBUTEROL 20-100 MCG/ACT IN AERS
1.0000 | INHALATION_SPRAY | Freq: Four times a day (QID) | RESPIRATORY_TRACT | Status: DC
  Administered 2019-10-15 – 2019-10-23 (×34): 1 via RESPIRATORY_TRACT
  Filled 2019-10-15: qty 4

## 2019-10-15 MED ORDER — ZINC SULFATE 220 (50 ZN) MG PO CAPS
220.0000 mg | ORAL_CAPSULE | Freq: Every day | ORAL | Status: DC
  Administered 2019-10-15 – 2019-10-23 (×9): 220 mg via ORAL
  Filled 2019-10-15 (×9): qty 1

## 2019-10-15 MED ORDER — SODIUM CHLORIDE 0.9 % IV SOLN
2.0000 g | INTRAVENOUS | Status: AC
  Administered 2019-10-15 – 2019-10-20 (×6): 2 g via INTRAVENOUS
  Filled 2019-10-15 (×6): qty 20

## 2019-10-15 MED ORDER — ENOXAPARIN SODIUM 100 MG/ML ~~LOC~~ SOLN
0.5000 mg/kg | Freq: Two times a day (BID) | SUBCUTANEOUS | Status: DC
  Administered 2019-10-15 – 2019-10-23 (×16): 85 mg via SUBCUTANEOUS
  Filled 2019-10-15 (×19): qty 1

## 2019-10-15 MED ORDER — DIPHENHYDRAMINE HCL 25 MG PO CAPS
25.0000 mg | ORAL_CAPSULE | Freq: Four times a day (QID) | ORAL | Status: DC | PRN
  Administered 2019-10-15 – 2019-10-22 (×9): 25 mg via ORAL
  Filled 2019-10-15 (×9): qty 1

## 2019-10-15 MED ORDER — OXYCODONE HCL 5 MG PO TABS
5.0000 mg | ORAL_TABLET | ORAL | Status: DC | PRN
  Administered 2019-10-17 – 2019-10-23 (×13): 5 mg via ORAL
  Filled 2019-10-15 (×13): qty 1

## 2019-10-15 MED ORDER — GUAIFENESIN-DM 100-10 MG/5ML PO SYRP
10.0000 mL | ORAL_SOLUTION | ORAL | Status: DC | PRN
  Administered 2019-10-15 – 2019-10-21 (×12): 10 mL via ORAL
  Filled 2019-10-15 (×12): qty 10

## 2019-10-15 MED ORDER — OXYCODONE-ACETAMINOPHEN 5-325 MG PO TABS
1.0000 | ORAL_TABLET | ORAL | Status: DC | PRN
  Administered 2019-10-15 – 2019-10-21 (×16): 1 via ORAL
  Filled 2019-10-15 (×16): qty 1

## 2019-10-15 MED ORDER — POLYETHYLENE GLYCOL 3350 17 G PO PACK
17.0000 g | PACK | Freq: Every day | ORAL | Status: DC | PRN
  Filled 2019-10-15: qty 1

## 2019-10-15 MED ORDER — INSULIN DETEMIR 100 UNIT/ML ~~LOC~~ SOLN
50.0000 [IU] | Freq: Two times a day (BID) | SUBCUTANEOUS | Status: DC
  Administered 2019-10-15 (×2): 50 [IU] via SUBCUTANEOUS
  Filled 2019-10-15 (×3): qty 0.5

## 2019-10-15 MED ORDER — ENOXAPARIN SODIUM 100 MG/ML ~~LOC~~ SOLN
85.0000 mg | SUBCUTANEOUS | Status: DC
  Administered 2019-10-15: 85 mg via SUBCUTANEOUS
  Filled 2019-10-15 (×2): qty 1

## 2019-10-15 MED ORDER — ACETAMINOPHEN 650 MG RE SUPP: 650.0000 mg | Freq: Four times a day (QID) | RECTAL | Status: DC | PRN

## 2019-10-15 MED ORDER — INSULIN ASPART 100 UNIT/ML ~~LOC~~ SOLN
0.0000 [IU] | Freq: Every day | SUBCUTANEOUS | Status: DC
  Administered 2019-10-15: 5 [IU] via SUBCUTANEOUS
  Administered 2019-10-16: 3 [IU] via SUBCUTANEOUS
  Administered 2019-10-17: 5 [IU] via SUBCUTANEOUS
  Administered 2019-10-18: 2 [IU] via SUBCUTANEOUS

## 2019-10-15 MED ORDER — BENZONATATE 100 MG PO CAPS
100.0000 mg | ORAL_CAPSULE | Freq: Three times a day (TID) | ORAL | Status: DC
  Administered 2019-10-15 – 2019-10-23 (×25): 100 mg via ORAL
  Filled 2019-10-15 (×25): qty 1

## 2019-10-15 MED ORDER — DEXAMETHASONE SODIUM PHOSPHATE 10 MG/ML IJ SOLN
6.0000 mg | Freq: Every day | INTRAMUSCULAR | Status: DC
  Administered 2019-10-15 – 2019-10-19 (×5): 6 mg via INTRAVENOUS
  Filled 2019-10-15 (×5): qty 1

## 2019-10-15 NOTE — Progress Notes (Addendum)
PROGRESS NOTE  Jesse Washington  HDQ:[id number] DOB: 10-Jan-1971 DOA: 10/14/2019 PCP: Patient, No Pcp Per  Brief Narrative: KOEN ANTILLA is a 49 y.o. male with a history of morbid obesity, IDT2DM, HTN, and asthma who presented to the ED on 1/23 by EMS with 1 week of worsening dyspnea having been diagnosed with covid on 1/15 and presented on 1/19 (was discharged with prednisone as he was not hypoxemic). On arrival he required 15L NRB, was tachypneic, tachycardic, with temperature of 99.59F. WBC 17k, creatinine 1.63, and anion gap metabolic acidosis with severe hyperglycemia and elevated serum and urine ketones. PCT 0.44, CRP 36.8, d-dimer 0.74. CXR showed bibasilar infiltrates with left pleural effusion. Insulin infusion, IVF, remdesivir and steroids were given and the patient admitted to [facility]. On 1/24, anion gap has closed, glucose control improved, AKI resolving. Hypoxemia and dyspnea remain severe, and inflammatory markers remain severely elevated. Tocilizumab, ceftriaxone, and azithromycin are added and patient transitioned to basal-bolus insulin.  Assessment & Plan: Principal Problem:   Pneumonia due to COVID-19 virus Active Problems:   DKA (diabetic ketoacidoses) (HCC)   Type 2 diabetes mellitus without complication, with long-term current use of insulin (HCC)   Hyponatremia   High anion gap metabolic acidosis   Acute on chronic respiratory failure with hypoxia (HCC)   Morbid obesity with BMI of 50.0-59.9, adult (HCC)   Asthma   Leukocytosis  Acute hypoxemic respiratory failure due to covid-19 pneumonia, possible superimposed bacterial pneumonia: SARS-CoV-2 Ag positive on 1/15.  - Continue supplemental oxygen to maintain SpO2 >85% - Continue remdesivir x5 days 1/23 - 1/27 - Steroids x10 days  - Give tocilizumab 1/24. Off label nature of this medication, it's risks and potential benefits were described in detail, discussed at length and the patient opts to proceed.  - Add empiric 5  day course ceftriaxone, azithromycin with elevated PCT, severity of illness, history of splenectomy, and use of tocilizumab. - Vitamin C, zinc - Encourage OOB, IS, FV, and awake proning if able - Tylenol and antitussives prn - Continue airborne, contact precautions recommended for 21 days from positive testing. - Check CBC w/diff, CMP, CRP, d-dimer daily - Enoxaparin intermediate dose due to severity of inflammation/hypercoagulability. Monitor CBC. - Maintain euvolemia/net negative.  - Avoid NSAIDs  - Inhaled BDs.  IDT2DM uncontrolled with hyperglycemia, DKA on admission: HbA1c 14.1% indicates average glucose >350mg /dl, and he hasn't taken insulin since running out in a couple weeks. - Serial labs showing closure of anion gap, resolution of acidosis, diminished serum ketones. He's eating/drinking ad lib. Currently requiring 10-30U/hr which is significantly more than his prescribed amounts. Based on this, BMI 50, and need for steroids, we will start levemir 50 units BID units BID, 20 units mealtime insulin + resistant SSI and HS coverage. If current needs are indicative, he may require much higher doses for euglycemia, but will certainly not fall back into ketoacidosis. Stop gtt's (fluids and insulin) 2 hours after levemir first given, d/w RN.  - Diabetes coordinator consulted.   AKI: SCr improved 1.63 > 1.20. CrCl >158ml/min. - Avoid nephrotoxins.  HTN: BP controlled, continue to monitor  DVT prophylaxis: Lovenox 0.5mg /kg q12h Code Status: Full Family Communication: None at bedside, patient to relay POC Disposition Plan: Uncertain, pending clinical progress. Guarded prognosis.  Consultants:   None  Procedures:   None  Antimicrobials:  Remdesivir 1/23 - 1/27  Ceftriaxone, azithromycin 1/24 - 1/28   Subjective: Breathing is better, remains moderately short of breath even at rest with cough and associated  chest pain that is severe. Eating fine.   Objective: Vitals:   10/15/19  0041 10/15/19 0400 10/15/19 0919 10/15/19 1300  BP: 129/76 138/80 123/77 134/72  Pulse: 95 88 100 90  Resp: (!) 26 12 (!) 24 (!) 22  Temp: 98.9 F (37.2 C) 98.7 F (37.1 C)  98 F (36.7 C)  TempSrc: Oral Oral  Oral  SpO2:  95% 95% 92%  Weight: (!) 173.6 kg     Height: 6\' 1"  (1.854 m)       Intake/Output Summary (Last 24 hours) at 10/15/2019 1455 Last data filed at 10/15/2019 10/17/2019 Gross per 24 hour  Intake 3864.69 ml  Output --  Net 3864.69 ml   Filed Weights   10/15/19 0041  Weight: (!) 173.6 kg    Gen: 49 y.o. male in no distress Pulm: Tachypnea with bibasilar crackles  CV: Regular rate and rhythm. No murmur, rub, or gallop. No definite JVD, no pitting pedal edema. GI: Abdomen soft, non-tender, non-distended, with normoactive bowel sounds. No organomegaly or masses felt. Ext: Warm, no deformities Skin: No rashes, lesions or ulcers Neuro: Alert and oriented. No focal neurological deficits. Psych: Judgement and insight appear normal. Mood & affect appropriate.   Data Reviewed: I have personally reviewed following labs and imaging studies  CBC: Recent Labs  Lab 10/10/19 1332 10/14/19 1319  WBC 12.8* 17.0*  NEUTROABS 7.7 12.6*  HGB 16.4 16.0  HCT 48.6 48.7  MCV 94.0 95.5  PLT 282 358   Basic Metabolic Panel: Recent Labs  Lab 10/10/19 1332 10/14/19 1319 10/14/19 2013 10/15/19 0420  NA 132* 133* 136 137  K 3.9 4.9 4.9 4.3  CL 94* 92* 101 102  CO2 25 25 22 23   GLUCOSE 397* 416* 311* 264*  BUN 23* 27* 27* 27*  CREATININE 1.90* 1.63* 1.50* 1.20  CALCIUM 9.4 9.3 8.9 9.1   GFR: Estimated Creatinine Clearance: 123.7 mL/min (by C-G formula based on SCr of 1.2 mg/dL). Liver Function Tests: Recent Labs  Lab 10/14/19 1319  AST 24  ALT 22  ALKPHOS 72  BILITOT 1.0  PROT 8.9*  ALBUMIN 3.0*   No results for input(s): LIPASE, AMYLASE in the last 168 hours. No results for input(s): AMMONIA in the last 168 hours. Coagulation Profile: No results for input(s):  INR, PROTIME in the last 168 hours. Cardiac Enzymes: No results for input(s): CKTOTAL, CKMB, CKMBINDEX, TROPONINI in the last 168 hours. BNP (last 3 results) No results for input(s): PROBNP in the last 8760 hours. HbA1C: Recent Labs    10/15/19 0420  HGBA1C 14.1*   CBG: Recent Labs  Lab 10/15/19 0840 10/15/19 0931 10/15/19 1043 10/15/19 1132 10/15/19 1248  GLUCAP 177* 247* 276* 205* 193*   Lipid Profile: Recent Labs    10/14/19 1319  TRIG 349*   Thyroid Function Tests: No results for input(s): TSH, T4TOTAL, FREET4, T3FREE, THYROIDAB in the last 72 hours. Anemia Panel: Recent Labs    10/14/19 1319 10/15/19 0420  FERRITIN 1,361* 1,412*   Urine analysis:    Component Value Date/Time   COLORURINE YELLOW 10/14/2019 1613   APPEARANCEUR CLEAR 10/14/2019 1613   LABSPEC 1.027 10/14/2019 1613   PHURINE 5.0 10/14/2019 1613   GLUCOSEU >=500 (A) 10/14/2019 1613   HGBUR NEGATIVE 10/14/2019 1613   BILIRUBINUR NEGATIVE 10/14/2019 1613   KETONESUR 80 (A) 10/14/2019 1613   PROTEINUR 30 (A) 10/14/2019 1613   NITRITE NEGATIVE 10/14/2019 1613   LEUKOCYTESUR NEGATIVE 10/14/2019 1613   Recent Results (from the past 240 hour(s))  SARS CORONAVIRUS 2 (TAT 6-24 HRS) Nasopharyngeal Nasopharyngeal Swab     Status: Abnormal   Collection Time: 10/06/19  3:39 PM   Specimen: Nasopharyngeal Swab  Result Value Ref Range Status   SARS Coronavirus 2 POSITIVE (A) NEGATIVE Final    Comment: RESULT CALLED TO, READ BACK BY AND VERIFIED WITH: EMAILED Aretta Nip 0303 10/07/2019 T. TYSOR (NOTE) SARS-CoV-2 target nucleic acids are DETECTED. The SARS-CoV-2 RNA is generally detectable in upper and lower respiratory specimens during the acute phase of infection. Positive results are indicative of the presence of SARS-CoV-2 RNA. Clinical correlation with patient history and other diagnostic information is  necessary to determine patient infection status. Positive results do not rule out bacterial  infection or co-infection with other viruses.  The expected result is Negative. Fact Sheet for Patients: SugarRoll.be Fact Sheet for Healthcare Providers: https://www.woods-mathews.com/ This test is not yet approved or cleared by the [place] FDA and  has been authorized for detection and/or diagnosis of SARS-CoV-2 by FDA under an Emergency Use Authorization (EUA). This EUA will remain  in effect (meaning this test can be used ) for the duration of the COVID-19 declaration under Section 564(b)(1) of the Act, 21 U.S.C. section 360bbb-3(b)(1), unless the authorization is terminated or revoked sooner. Performed at [facility], [place] [address] [place], [place] [address]   Blood Culture (routine x 2)     Status: None (Preliminary result)   Collection Time: 10/14/19  2:05 PM   Specimen: Right Antecubital; Blood  Result Value Ref Range Status   Specimen Description   Final    RIGHT ANTECUBITAL Performed at [facility] [address] [place], [place] [address]    Special Requests   Final    BOTTLES DRAWN AEROBIC AND ANAEROBIC Blood Culture adequate volume Performed at [facility] [address] [place], [place] [address]    Culture   Final    NO GROWTH < 24 HOURS Performed at [facility] [address], [place],  [address]    Report Status PENDING  Incomplete      Radiology Studies: DG Chest Port 1 View  Result Date: 10/14/2019 CLINICAL DATA:  Shortness of breath. COVID positive. EXAM: PORTABLE CHEST 1 VIEW COMPARISON:  Chest x-ray dated 10/10/2019. FINDINGS: Bibasilar opacities are likely increased. Suspected small LEFT pleural effusion. No pneumothorax seen. Heart size and mediastinal contours appear stable. IMPRESSION: 1. Suspected worsening bibasilar pneumonia. 2. Probable small LEFT pleural effusion. Electronically Signed   By: Franki Cabot M.D.   On: 10/14/2019 13:43     Scheduled Meds: . vitamin C  500 mg Oral Daily  . [START ON 10/16/2019] azithromycin  250 mg Oral Daily  . benzonatate  100 mg Oral Q8H  . dexamethasone (DECADRON) injection  6 mg Intravenous Q0600  . enoxaparin (LOVENOX) injection  85 mg Subcutaneous Q24H  . Ipratropium-Albuterol  1 puff Inhalation Q6H  . zinc sulfate  220 mg Oral Daily   Continuous Infusions: . sodium chloride Stopped (10/14/19 2310)  . sodium chloride 75 mL/hr at 10/14/19 2358  . cefTRIAXone (ROCEPHIN)  IV 2 g (10/15/19 1130)  . dextrose 5 % and 0.45% NaCl 75 mL/hr at 10/15/19 0729  . insulin 16 Units/hr (10/15/19 1454)  . remdesivir 100 mg in NS 100 mL 100 mg (10/15/19 1041)     LOS: 1 day   Time spent: 35 minutes.  Patrecia Pour, MD [facility] [email or web address] 10/15/2019, 2:55 PM

## 2019-10-16 LAB — COMPREHENSIVE METABOLIC PANEL
ALT: 30 U/L (ref 0–44)
AST: 35 U/L (ref 15–41)
Albumin: 2.3 g/dL — ABNORMAL LOW (ref 3.5–5.0)
Alkaline Phosphatase: 57 U/L (ref 38–126)
Anion gap: 10 (ref 5–15)
BUN: 34 mg/dL — ABNORMAL HIGH (ref 6–20)
CO2: 23 mmol/L (ref 22–32)
Calcium: 9.1 mg/dL (ref 8.9–10.3)
Chloride: 102 mmol/L (ref 98–111)
Creatinine, Ser: 1.36 mg/dL — ABNORMAL HIGH (ref 0.61–1.24)
GFR calc Af Amer: >60 mL/min (ref >60)
GFR calc non Af Amer: >60 mL/min (ref >60)
Glucose, Bld: 400 mg/dL — ABNORMAL HIGH (ref 70–99)
Potassium: 4.9 mmol/L (ref 3.5–5.1)
Sodium: 135 mmol/L (ref 135–145)
Total Bilirubin: 0.2 mg/dL — ABNORMAL LOW (ref 0.3–1.2)
Total Protein: 7 g/dL (ref 6.5–8.1)

## 2019-10-16 LAB — D-DIMER, QUANTITATIVE: D-Dimer, Quant: 0.64 ug/mL-FEU — ABNORMAL HIGH (ref 0.00–0.50)

## 2019-10-16 LAB — GLUCOSE, CAPILLARY
Glucose-Capillary: 380 mg/dL — ABNORMAL HIGH (ref 70–99)
Glucose-Capillary: 362 mg/dL — ABNORMAL HIGH (ref 70–99)
Glucose-Capillary: 321 mg/dL — ABNORMAL HIGH (ref 70–99)

## 2019-10-16 LAB — FERRITIN: Ferritin: 1285 ng/mL — ABNORMAL HIGH (ref 24–336)

## 2019-10-16 LAB — C-REACTIVE PROTEIN: CRP: 15.6 mg/dL — ABNORMAL HIGH (ref <1.0)

## 2019-10-16 MED ORDER — INSULIN ASPART 100 UNIT/ML ~~LOC~~ SOLN: 30.0000 [IU] | Freq: Three times a day (TID) | SUBCUTANEOUS | Status: DC

## 2019-10-16 MED ORDER — INSULIN DETEMIR 100 UNIT/ML ~~LOC~~ SOLN
50.0000 [IU] | Freq: Three times a day (TID) | SUBCUTANEOUS | Status: DC
  Administered 2019-10-16 – 2019-10-17 (×4): 50 [IU] via SUBCUTANEOUS
  Filled 2019-10-16 (×6): qty 0.5

## 2019-10-16 NOTE — Progress Notes (Signed)
Jesse NOTE  DMONI FORTSON  JOA:[id number] DOB: Feb 03, 1971 DOA: 10/14/2019 PCP: Patient, No Pcp Per  Brief Narrative: Jesse Washington is a 49 y.o. male with a history of morbid obesity, IDT2DM, HTN, and asthma who presented to the ED on 1/23 by EMS with 1 week of worsening dyspnea having been diagnosed with covid on 1/15 and presented on 1/19 (was discharged with prednisone as he was not hypoxemic). On arrival he required 15L NRB, was tachypneic, tachycardic, with temperature of 99.97F. WBC 17k, creatinine 1.63, and anion gap metabolic acidosis with severe hyperglycemia and elevated serum and urine ketones. PCT 0.44, CRP 36.8, d-dimer 0.74. CXR showed bibasilar infiltrates with left pleural effusion. Insulin infusion, IVF, remdesivir and steroids were given and the patient admitted to [facility]. On 1/24, anion gap has closed, glucose control improved, AKI resolving. Hypoxemia and dyspnea remain severe, and inflammatory markers remain severely elevated. Tocilizumab, ceftriaxone, and azithromycin are added and patient transitioned to basal-bolus insulin.  Assessment & Plan: Principal Problem:   Pneumonia due to COVID-19 virus Active Problems:   DKA (diabetic ketoacidoses) ([place])   Type 2 diabetes mellitus without complication, with long-term current use of insulin (HCC)   Hyponatremia   High anion gap metabolic acidosis   Acute on chronic respiratory failure with hypoxia (HCC)   Morbid obesity with BMI of 50.0-59.9, adult (HCC)   Asthma   Leukocytosis  Acute hypoxemic respiratory failure due to covid-19 pneumonia, possible superimposed bacterial pneumonia: SARS-CoV-2 Ag positive on 1/15.  - Continue supplemental oxygen to maintain SpO2 >85%. Still on significant supplemental oxygen 8L HFNC. - Continue remdesivir x5 days 1/23 - 1/27 - Steroids x10 days. Despite ongoing elevation in inflammatory markers, toci was given and prefer to avoid worsening steroid-induced hyperglycemia so will continue  at current dose. - S/p tocilizumab 1/24. - Continue empiric 5 day course ceftriaxone, azithromycin (1/24 - 1/28) with elevated PCT, severity of illness, history of splenectomy, and use of tocilizumab. - Vitamin C, zinc - Encourage OOB, IS, FV, and awake proning if able - Tylenol and antitussives prn - Continue airborne, contact precautions recommended for 21 days from positive testing. - Enoxaparin intermediate dose due to severity of inflammation/hypercoagulability. Monitor CBC. - Maintain euvolemia/net negative.  - Avoid NSAIDs  - Inhaled BDs.  IDT2DM uncontrolled with hyperglycemia, DKA on admission: HbA1c 14.1% indicates average glucose >350mg /dl, and he hasn't taken insulin since running out in a couple weeks. Was also elevated this high in 2019.  - Converted to basal-bolus insulin after gap closure, clearance of serum ketones on 1/24. Will augment further today to levemir 50u TID (for rapid titration), mealtime up to 20 u today, 30u TIDWC tomorrow. Continue resistant SSI. and HS correction - Diabetes coordinator consulted, appreciate education provided. Pt will need Rx for glucometer (#[phone]) and insulin pen needles [phone]).   AKI: SCr improved from 1.63 on admission. CrCl >76ml/min. - Avoid nephrotoxins.  HTN: BP controlled, continue to monitor  DVT prophylaxis: Lovenox 0.5mg /kg q12h Code Status: Full Family Communication: None at bedside, patient to relay POC Disposition Plan: Uncertain, pending clinical Jesse. Guarded prognosis.  Consultants:   None  Procedures:   None  Antimicrobials:  Remdesivir 1/23 - 1/27  Ceftriaxone, azithromycin 1/24 - 1/28   Subjective: Still with moderate shortness of breath at rest, severe, constant with exertion better with rest and O2. Eating ok. Able to drink without vomiting/nausea. No diarrhea or abd pain.   Objective: Vitals:   10/16/19 0407 10/16/19 0816 10/16/19 1300 10/16/19 1636  BP:  140/77 128/63 132/76 129/82    Pulse: 74 67 83 81  Resp: (!) 24 20 (!) 22 18  Temp: 98.5 F (36.9 C) 98.4 F (36.9 C) 98.3 F (36.8 C) 98.4 F (36.9 C)  TempSrc: Oral Oral Oral Oral  SpO2: 94% 99% 94% 94%  Weight:      Height:        Intake/Output Summary (Last 24 hours) at 10/16/2019 1716 Last data filed at 10/16/2019 1103 Gross per 24 hour  Intake 1363.01 ml  Output 800 ml  Net 563.01 ml   Filed Weights   10/15/19 0041  Weight: (!) 173.6 kg   Gen: Obese, tired-appearing male in no distress Pulm: Nonlabored tachypnea on 8L HFNC. Crackles diffusely. CV: Regular rate and rhythm. No murmur, rub, or gallop. No JVD, no pitting dependent edema. GI: Abdomen soft, non-tender, non-distended, with normoactive bowel sounds.  Ext: Warm, no deformities Skin: No rashes, lesions or ulcers on visualized skin. Neuro: Alert and oriented. No focal neurological deficits. Psych: Judgement and insight appear fair. Mood euthymic & affect congruent. Behavior is appropriate.    Data Reviewed: I have personally reviewed following labs and imaging studies  CBC: Recent Labs  Lab 10/10/19 1332 10/14/19 1319  WBC 12.8* 17.0*  NEUTROABS 7.7 12.6*  HGB 16.4 16.0  HCT 48.6 48.7  MCV 94.0 95.5  PLT 282 358   Basic Metabolic Panel: Recent Labs  Lab 10/14/19 1319 10/14/19 1319 10/14/19 2013 10/15/19 0420 10/15/19 1250 10/15/19 2230 10/16/19 0350  NA 133*  --  136 137 138  --  135  K 4.9  --  4.9 4.3 5.2*  --  4.9  CL 92*  --  101 102 105  --  102  CO2 25  --  22 23 24   --  23  GLUCOSE 416*   < > 311* 264* 204* 463* 400*  BUN 27*  --  27* 27* 28*  --  34*  CREATININE 1.63*  --  1.50* 1.20 1.23  --  1.36*  CALCIUM 9.3  --  8.9 9.1 9.4  --  9.1   < > = values in this interval not displayed.   GFR: Estimated Creatinine Clearance: 109.1 mL/min (A) (by C-G formula based on SCr of 1.36 mg/dL (H)). Liver Function Tests: Recent Labs  Lab 10/14/19 1319 10/16/19 0350  AST 24 35  ALT 22 30  ALKPHOS 72 57  BILITOT 1.0  0.2*  PROT 8.9* 7.0  ALBUMIN 3.0* 2.3*   No results for input(s): LIPASE, AMYLASE in the last 168 hours. No results for input(s): AMMONIA in the last 168 hours. Coagulation Profile: No results for input(s): INR, PROTIME in the last 168 hours. Cardiac Enzymes: No results for input(s): CKTOTAL, CKMB, CKMBINDEX, TROPONINI in the last 168 hours. BNP (last 3 results) No results for input(s): PROBNP in the last 8760 hours. HbA1C: Recent Labs    10/15/19 0420  HGBA1C 14.1*   CBG: Recent Labs  Lab 10/15/19 1745 10/15/19 2112 10/16/19 0753 10/16/19 1243 10/16/19 1627  GLUCAP 154* 420* 380* 362* 321*   Lipid Profile: Recent Labs    10/14/19 1319  TRIG 349*   Thyroid Function Tests: No results for input(s): TSH, T4TOTAL, FREET4, T3FREE, THYROIDAB in the last 72 hours. Anemia Panel: Recent Labs    10/15/19 0420 10/16/19 0350  FERRITIN 1,412* 1,285*   Urine analysis:    Component Value Date/Time   COLORURINE YELLOW 10/14/2019 1613   APPEARANCEUR CLEAR 10/14/2019 1613   LABSPEC 1.027  10/14/2019 1613   PHURINE 5.0 10/14/2019 1613   GLUCOSEU >=500 (A) 10/14/2019 1613   HGBUR NEGATIVE 10/14/2019 1613   BILIRUBINUR NEGATIVE 10/14/2019 1613   KETONESUR 80 (A) 10/14/2019 1613   PROTEINUR 30 (A) 10/14/2019 1613   NITRITE NEGATIVE 10/14/2019 1613   LEUKOCYTESUR NEGATIVE 10/14/2019 1613   Recent Results (from the past 240 hour(s))  Blood Culture (routine x 2)     Status: None (Preliminary result)   Collection Time: 10/14/19  2:05 PM   Specimen: Right Antecubital; Blood  Result Value Ref Range Status   Specimen Description   Final    RIGHT ANTECUBITAL Performed at [facility], [address], [place], [place] [address]    Special Requests   Final    BOTTLES DRAWN AEROBIC AND ANAEROBIC Blood Culture adequate volume Performed at [facility], [address] [place], [place] [address]    Culture   Final    NO GROWTH 2  DAYS Performed at [facility], [address], [place], [place] [address]    Report Status PENDING  Incomplete      Radiology Studies: No results found.  Scheduled Meds: . vitamin C  500 mg Oral Daily  . azithromycin  250 mg Oral Daily  . benzonatate  100 mg Oral Q8H  . dexamethasone (DECADRON) injection  6 mg Intravenous Q0600  . enoxaparin (LOVENOX) injection  0.5 mg/kg Subcutaneous Q12H  . insulin aspart  0-20 Units Subcutaneous TID WC  . insulin aspart  0-5 Units Subcutaneous QHS  . insulin aspart  20 Units Subcutaneous TID WC  . insulin detemir  50 Units Subcutaneous TID  . Ipratropium-Albuterol  1 puff Inhalation Q6H  . zinc sulfate  220 mg Oral Daily   Continuous Infusions: . cefTRIAXone (ROCEPHIN)  IV 2 g (10/16/19 1040)  . remdesivir 100 mg in NS 100 mL Stopped (10/16/19 1103)     LOS: 2 days   Time spent: 35 minutes.  Tyrone Nine, MD [facility] [email or web address] 10/16/2019, 5:16 PM

## 2019-10-16 NOTE — Progress Notes (Addendum)
Inpatient Diabetes Program Recommendations  AACE/ADA: New Consensus Statement on Inpatient Glycemic Control  Target Ranges:  Prepandial:   less than 140 mg/dL      Peak postprandial:   less than 180 mg/dL (1-2 hours)      Critically ill patients:  140 - 180 mg/dL  Results for Mealing, Gryphon V (MRN [id number]) as of 10/16/2019 08:44  Ref. Range 10/15/2019 08:40 10/15/2019 09:31 10/15/2019 10:43 10/15/2019 11:32 10/15/2019 12:48 10/15/2019 13:58 10/15/2019 14:47 10/15/2019 17:45 10/15/2019 21:12 10/16/2019 07:53  Glucose-Capillary Latest Ref Range: 70 - 99 mg/dL 177 (H) 247 (H) 276 (H) 205 (H) 193 (H) 196 (H) 206 (H) 154 (H) 420 (H) 380 (H)   Results for Gaut, Nethaniel V (MRN [id number]) as of 10/16/2019 08:44  Ref. Range 10/15/2019 04:20  Hemoglobin A1C Latest Ref Range: 4.8 - 5.6 % 14.1 (H)   Review of Glycemic Control  Diabetes history: DM2 Outpatient Diabetes medications: Basaglar 20 units BID, Humalog per sliding scale Current orders for Inpatient glycemic control: Levemir 50 units TID, Novolog 20 units TID with meals, Novolog 0-20 units TID with meals, Novolog 0-5 units QHS; Decadron 6 mg daily  Inpatient Diabetes Program Recommendations:    Insulin-Basal: Noted Levemir was changed to 50 units TID today.  A1C: Current A1C 14.1% on 10/15/19 indicating an average glucose of 358 mg/dl over the past 2-3 months. Per note by Dr. Grunz on 10/15/19, patient ran out of insulin a couple of weeks ago.   NOTE: Noted consult for Diabetes Coordinator. Chart reviewed. Noted patient admitted with COVID, Pneumonia, DKA. Initial glucose 416 mg/dl on 10/14/19 and patient was started on IV insulin. Patient was given Levemir 50 units at 15:58 on 10/15/19 at time of transition from IV to SQ insulin. Per note by Dr. Grunz on 1/24 patient ran out of insulin a couple of weeks ago. Patient has out of state [facility] (per chart patient's address is in [place]).  In Care Everywhere, noted A1C of 14.1% on 08/24/2018. Will  follow glycemic trends today with insulin changes already made.  Addendum 10/16/19@14:30-Spoke with patient over the phone regarding DM control and inquire about needs for DM management. Patient states that he has been without insulin for about 1 month. He states that he has been in [place] and his insulin was in [place]. He states that his grandson just recently brought the insulin from [place] to [place] so he will have Basaglar and Humalog insulin when he gets discharged from the hospital. Patient states that he was taking Basaglar 20 units BID and using Humalog on a sliding scale TID with meals. Patient has not been checking glucose because he does not have a glucometer or testing supplies.  Discussed A1C of 14.1% on 10/15/19 indicating an average glucose of 358 mg/dl. Discussed A1C and glucose goals.  Stressed importance of improving DM control to decrease risk of complications from uncontrolled DM. Patient states that once he is well enough he is planning to go back to [place]. Discussed impact of steroids on glycemic control. Explained that if he is discharged on steroid taper, his glucose may run higher than usual and once steroids were tapered off he may need adjustments with insulin. Patient states his PCP is in [place]. Encouraged patient to reach out to his PCP if he has any issues with hyperglycemia or hypoglycemia once he is discharged from the hospital. Patient verbalized understanding of information discussed and he states he has no questions at this time related to DM.    At time of discharge please provide patient with Rx for: glucose monitoring kit (#[id number]) and insulin pen needles [phone]).   Thanks, Barnie Alderman, RN, MSN, CDE Diabetes Coordinator Inpatient Diabetes Program [phone] (Team Pager from 8am to 5pm)

## 2019-10-17 DIAGNOSIS — E871 Hypo-osmolality and hyponatremia: Secondary | ICD-10-CM

## 2019-10-17 DIAGNOSIS — E1110 Type 2 diabetes mellitus with ketoacidosis without coma: Secondary | ICD-10-CM

## 2019-10-17 DIAGNOSIS — Z794 Long term (current) use of insulin: Secondary | ICD-10-CM

## 2019-10-17 DIAGNOSIS — E119 Type 2 diabetes mellitus without complications: Secondary | ICD-10-CM

## 2019-10-17 DIAGNOSIS — E872 Acidosis: Secondary | ICD-10-CM

## 2019-10-17 DIAGNOSIS — J1282 Pneumonia due to coronavirus disease 2019: Secondary | ICD-10-CM

## 2019-10-17 DIAGNOSIS — Z6841 Body Mass Index (BMI) 40.0 and over, adult: Secondary | ICD-10-CM

## 2019-10-17 DIAGNOSIS — J45909 Unspecified asthma, uncomplicated: Secondary | ICD-10-CM

## 2019-10-17 DIAGNOSIS — D72829 Elevated white blood cell count, unspecified: Secondary | ICD-10-CM

## 2019-10-17 DIAGNOSIS — J9621 Acute and chronic respiratory failure with hypoxia: Secondary | ICD-10-CM

## 2019-10-17 DIAGNOSIS — U071 COVID-19: Principal | ICD-10-CM

## 2019-10-17 LAB — GLUCOSE, CAPILLARY
Glucose-Capillary: 253 mg/dL — ABNORMAL HIGH (ref 70–99)
Glucose-Capillary: 236 mg/dL — ABNORMAL HIGH (ref 70–99)
Glucose-Capillary: 292 mg/dL — ABNORMAL HIGH (ref 70–99)
Glucose-Capillary: 307 mg/dL — ABNORMAL HIGH (ref 70–99)
Glucose-Capillary: 404 mg/dL — ABNORMAL HIGH (ref 70–99)

## 2019-10-17 LAB — COMPREHENSIVE METABOLIC PANEL
ALT: 27 U/L (ref 0–44)
AST: 24 U/L (ref 15–41)
Albumin: 2.3 g/dL — ABNORMAL LOW (ref 3.5–5.0)
Alkaline Phosphatase: 53 U/L (ref 38–126)
Anion gap: 10 (ref 5–15)
BUN: 30 mg/dL — ABNORMAL HIGH (ref 6–20)
CO2: 25 mmol/L (ref 22–32)
Calcium: 9.1 mg/dL (ref 8.9–10.3)
Chloride: 102 mmol/L (ref 98–111)
Creatinine, Ser: 1.24 mg/dL (ref 0.61–1.24)
GFR calc Af Amer: >60 mL/min (ref >60)
GFR calc non Af Amer: >60 mL/min (ref >60)
Glucose, Bld: 210 mg/dL — ABNORMAL HIGH (ref 70–99)
Potassium: 4 mmol/L (ref 3.5–5.1)
Sodium: 137 mmol/L (ref 135–145)
Total Bilirubin: 0.4 mg/dL (ref 0.3–1.2)
Total Protein: 7 g/dL (ref 6.5–8.1)

## 2019-10-17 LAB — C-REACTIVE PROTEIN: CRP: 8.1 mg/dL — ABNORMAL HIGH (ref <1.0)

## 2019-10-17 LAB — D-DIMER, QUANTITATIVE: D-Dimer, Quant: <0.27 ug/mL-FEU (ref 0.00–0.50)

## 2019-10-17 LAB — FERRITIN: Ferritin: 761 ng/mL — ABNORMAL HIGH (ref 24–336)

## 2019-10-17 MED ORDER — INSULIN DETEMIR 100 UNIT/ML ~~LOC~~ SOLN
60.0000 [IU] | Freq: Three times a day (TID) | SUBCUTANEOUS | Status: DC
  Administered 2019-10-17 (×2): 60 [IU] via SUBCUTANEOUS
  Filled 2019-10-17 (×4): qty 0.6

## 2019-10-17 MED ORDER — INSULIN ASPART 100 UNIT/ML ~~LOC~~ SOLN
25.0000 [IU] | Freq: Three times a day (TID) | SUBCUTANEOUS | Status: DC
  Administered 2019-10-17 (×2): 25 [IU] via SUBCUTANEOUS

## 2019-10-17 MED ORDER — INSULIN ASPART 100 UNIT/ML ~~LOC~~ SOLN
30.0000 [IU] | Freq: Three times a day (TID) | SUBCUTANEOUS | Status: DC
  Administered 2019-10-17 – 2019-10-19 (×6): 30 [IU] via SUBCUTANEOUS

## 2019-10-17 NOTE — Progress Notes (Signed)
PROGRESS NOTE  Jesse Washington  VQQ:[id number] DOB: 1971/02/21 DOA: 10/14/2019 PCP: Patient, No Pcp Per  Brief Narrative: Jesse Washington is a 49 y.o. male with a history of morbid obesity, IDT2DM, HTN, and asthma who presented to the ED on 1/23 by EMS with 1 week of worsening dyspnea having been diagnosed with covid on 1/15 and presented on 1/19 (was discharged with prednisone as he was not hypoxemic). On arrival he required 15L NRB, was tachypneic, tachycardic, with temperature of 99.10F. WBC 17k, creatinine 1.63, and anion gap metabolic acidosis with severe hyperglycemia and elevated serum and urine ketones. PCT 0.44, CRP 36.8, d-dimer 0.74. CXR showed bibasilar infiltrates with left pleural effusion. Insulin infusion, IVF, remdesivir and steroids were given and the patient admitted to [facility]. On 1/24, anion gap has closed, glucose control improved, AKI resolving. Hypoxemia and dyspnea remain severe, and inflammatory markers remain severely elevated. Tocilizumab, ceftriaxone, and azithromycin are added and patient transitioned to basal-bolus insulin.  Assessment & Plan: Principal Problem:   Pneumonia due to COVID-19 virus Active Problems:   DKA (diabetic ketoacidoses) ([place])   Type 2 diabetes mellitus without complication, with long-term current use of insulin (HCC)   Hyponatremia   High anion gap metabolic acidosis   Acute on chronic respiratory failure with hypoxia (HCC)   Morbid obesity with BMI of 50.0-59.9, adult (HCC)   Asthma   Leukocytosis  Acute hypoxemic respiratory failure due to covid-19 pneumonia, possible superimposed bacterial pneumonia: SARS-CoV-2 Ag positive on 1/15.  - Continue supplemental oxygen to maintain SpO2 >85%. Still on significant supplemental oxygen, but stable. - Continue remdesivir x5 days 1/23 - 1/27 - Steroids x10 days. Despite ongoing elevation in inflammatory markers, toci was given and prefer to avoid worsening steroid-induced hyperglycemia so will  continue at current dose. - S/p tocilizumab 1/24. - Continue empiric 5 day course ceftriaxone, azithromycin (1/24 - 1/28) with elevated PCT, severity of illness, history of splenectomy, and use of tocilizumab. - Vitamin C, zinc - Encourage OOB, IS, FV, and awake proning if able - Tylenol and antitussives prn - Continue airborne, contact precautions recommended for 21 days from positive testing. - Enoxaparin intermediate dose due to severity of inflammation/hypercoagulability. Monitor CBC. - Maintain euvolemia/net negative.  - Avoid NSAIDs  - Inhaled BDs.  IDT2DM uncontrolled with hyperglycemia, DKA on admission: HbA1c 14.1% indicates average glucose >350mg /dl, and he hasn't taken insulin since running out in a couple weeks. Was also elevated this high in 2019. Converted to basal-bolus insulin after gap closure, clearance of serum ketones on 1/24.  - Continue levemir 50u TID (for rapid titration), mealtime up to 25 u today with resistant SSI. and HS correction - Diabetes coordinator consulted, appreciate education provided. Pt will need Rx for glucometer (#[phone]) and insulin pen needles [phone]).   AKI: SCr improved from 1.63 on admission. CrCl >76ml/min. - Avoid nephrotoxins.  HTN: BP controlled, continue to monitor  DVT prophylaxis: Lovenox 0.5mg /kg q12h Code Status: Full Family Communication: None at bedside, patient to relay POC Disposition Plan: Uncertain, pending clinical progress. Guarded prognosis.  Consultants:   None  Procedures:   None  Antimicrobials:  Remdesivir 1/23 - 1/27  Ceftriaxone, azithromycin 1/24 - 1/28   Subjective: Shortness of breath is stable, moderate even at rest, associated with cough which is worsening chronic back pain. Chest pain only with cough, not exertional. No leg swelling, tenderness.   Objective: Vitals:   10/16/19 2003 10/16/19 2339 10/17/19 0254 10/17/19 0838  BP: 118/73 (!) 146/87 (!) 128/59 (!) 144/77  Pulse: 79 68 92 64    Resp: (!) 27 (!) 22 (!) 24 20  Temp: 97.6 F (36.4 C) 97.9 F (36.6 C) 97.7 F (36.5 C)   TempSrc: Oral Oral Oral   SpO2: 94% 97% 94% 94%  Weight:      Height:        Intake/Output Summary (Last 24 hours) at 10/17/2019 0940 Last data filed at 10/17/2019 0400 Gross per 24 hour  Intake 200 ml  Output 1350 ml  Net -1150 ml   Filed Weights   10/15/19 0041  Weight: (!) 173.6 kg   Gen: Obese male in no distress Pulm: Nonlabored breathing HFNC. Crackles throughout. CV: Regular rate and rhythm. No murmur, rub, or gallop. No JVD, no dependent edema. GI: Abdomen soft, non-tender, non-distended, with normoactive bowel sounds.  Ext: Warm, no deformities Skin: No rashes, lesions or ulcers on visualized skin. Neuro: Alert and oriented. No focal neurological deficits. Psych: Judgement and insight appear fair. Mood euthymic & affect congruent. Behavior is appropriate.    Data Reviewed: I have personally reviewed following labs and imaging studies  CBC: Recent Labs  Lab 10/10/19 1332 10/14/19 1319  WBC 12.8* 17.0*  NEUTROABS 7.7 12.6*  HGB 16.4 16.0  HCT 48.6 48.7  MCV 94.0 95.5  PLT 282 358   Basic Metabolic Panel: Recent Labs  Lab 10/14/19 2013 10/14/19 2013 10/15/19 0420 10/15/19 1250 10/15/19 2230 10/16/19 0350 10/17/19 0323  NA 136  --  137 138  --  135 137  K 4.9  --  4.3 5.2*  --  4.9 4.0  CL 101  --  102 105  --  102 102  CO2 22  --  23 24  --  23 25  GLUCOSE 311*   < > 264* 204* 463* 400* 210*  BUN 27*  --  27* 28*  --  34* 30*  CREATININE 1.50*  --  1.20 1.23  --  1.36* 1.24  CALCIUM 8.9  --  9.1 9.4  --  9.1 9.1   < > = values in this interval not displayed.   GFR: Estimated Creatinine Clearance: 119.7 mL/min (by C-G formula based on SCr of 1.24 mg/dL). Liver Function Tests: Recent Labs  Lab 10/14/19 1319 10/16/19 0350 10/17/19 0323  AST 24 35 24  ALT 22 30 27   ALKPHOS 72 57 53  BILITOT 1.0 0.2* 0.4  PROT 8.9* 7.0 7.0  ALBUMIN 3.0* 2.3* 2.3*    No results for input(s): LIPASE, AMYLASE in the last 168 hours. No results for input(s): AMMONIA in the last 168 hours. Coagulation Profile: No results for input(s): INR, PROTIME in the last 168 hours. Cardiac Enzymes: No results for input(s): CKTOTAL, CKMB, CKMBINDEX, TROPONINI in the last 168 hours. BNP (last 3 results) No results for input(s): PROBNP in the last 8760 hours. HbA1C: Recent Labs    10/15/19 0420  HGBA1C 14.1*   CBG: Recent Labs  Lab 10/15/19 2112 10/16/19 0753 10/16/19 1243 10/16/19 1627 10/17/19 0729  GLUCAP 420* 380* 362* 321* 236*   Lipid Profile: Recent Labs    10/14/19 1319  TRIG 349*   Thyroid Function Tests: No results for input(s): TSH, T4TOTAL, FREET4, T3FREE, THYROIDAB in the last 72 hours. Anemia Panel: Recent Labs    10/16/19 0350 10/17/19 0323  FERRITIN 1,285* 761*   Urine analysis:    Component Value Date/Time   COLORURINE YELLOW 10/14/2019 1613   APPEARANCEUR CLEAR 10/14/2019 1613   LABSPEC 1.027 10/14/2019 1613   PHURINE  5.0 10/14/2019 1613   GLUCOSEU >=500 (A) 10/14/2019 1613   HGBUR NEGATIVE 10/14/2019 1613   BILIRUBINUR NEGATIVE 10/14/2019 1613   KETONESUR 80 (A) 10/14/2019 1613   PROTEINUR 30 (A) 10/14/2019 1613   NITRITE NEGATIVE 10/14/2019 1613   LEUKOCYTESUR NEGATIVE 10/14/2019 1613   Recent Results (from the past 240 hour(s))  Blood Culture (routine x 2)     Status: None (Preliminary result)   Collection Time: 10/14/19  2:05 PM   Specimen: Right Antecubital; Blood  Result Value Ref Range Status   Specimen Description   Final    RIGHT ANTECUBITAL Performed at [facility], [address], [place], [place] [address]    Special Requests   Final    BOTTLES DRAWN AEROBIC AND ANAEROBIC Blood Culture adequate volume Performed at [facility], [address], [place], [place] [address]    Culture   Final    NO GROWTH 2 DAYS Performed at [facility], [address], [place], [place] [address]    Report Status PENDING  Incomplete      Radiology Studies: No results found.  Scheduled Meds: . vitamin C  500 mg Oral Daily  . azithromycin  250 mg Oral Daily  . benzonatate  100 mg Oral Q8H  . dexamethasone (DECADRON) injection  6 mg Intravenous Q0600  . enoxaparin (LOVENOX) injection  0.5 mg/kg Subcutaneous Q12H  . insulin aspart  0-20 Units Subcutaneous TID WC  . insulin aspart  0-5 Units Subcutaneous QHS  . insulin aspart  25 Units Subcutaneous TID WC  . insulin detemir  50 Units Subcutaneous TID  . Ipratropium-Albuterol  1 puff Inhalation Q6H  . zinc sulfate  220 mg Oral Daily   Continuous Infusions: . cefTRIAXone (ROCEPHIN)  IV 2 g (10/16/19 1040)  . remdesivir 100 mg in NS 100 mL Stopped (10/16/19 1103)     LOS: 3 days   Time spent: 35 minutes.  Tyrone Nine, MD [facility] [email or web address] 10/17/2019, 9:40 AM

## 2019-10-17 NOTE — Plan of Care (Signed)
Plan of Care reviewed. 

## 2019-10-17 NOTE — Progress Notes (Addendum)
Inpatient Diabetes Program Recommendations  AACE/ADA: New Consensus Statement on Inpatient Glycemic Control (2015)  Target Ranges:  Prepandial:   less than 140 mg/dL      Peak postprandial:   less than 180 mg/dL (1-2 hours)      Critically ill patients:  140 - 180 mg/dL   Results for Jesse Washington, Jesse Washington (MRN [id number]) as of 10/17/2019 08:48  Ref. Range 10/16/2019 07:53 10/16/2019 12:43 10/16/2019 16:27 10/17/2019 07:29  Glucose-Capillary Latest Ref Range: 70 - 99 mg/dL 380 (H) 362 (H) 321 (H) 236 (H)   Review of Glycemic Control  Diabetes history: DM2 Outpatient Diabetes medications: Basaglar 20 units BID, Humalog per sliding scale Current orders for Inpatient glycemic control: Levemir 50 units TID, Novolog 25 units TID with meals, Novolog 0-20 units TID with meals, Novolog 0-5 units QHS; Decadron 6 mg daily  Inpatient Diabetes Program Recommendations:    Insulin-Basal: If steroids are continued, please consider changing Levemir to 80 units BID (to start this morning).  Insulin-Meal Coverage: Noted meal coverage increased to Novolog 25 units TID with meals today.  A1C: Current A1C 14.1% on 10/15/19 indicating an average glucose of 358 mg/dl over the past 2-3 months. Patient reports that he was about of insulin for about 1 month. His grandson brought his insulin from [place] so he will have it when he is discharged from the hospital.  At time of discharge please provide patient with Rx for: glucose monitoring kit (#[id number]) and insulin pen needles [phone]).  Thanks, Barnie Alderman, RN, MSN, CDE Diabetes Coordinator Inpatient Diabetes Program [phone] (Team Pager from 8am to 5pm)

## 2019-10-18 ENCOUNTER — Inpatient Hospital Stay (HOSPITAL_COMMUNITY): Payer: Medicaid - Out of State

## 2019-10-18 ENCOUNTER — Inpatient Hospital Stay: Payer: Self-pay

## 2019-10-18 DIAGNOSIS — Z794 Long term (current) use of insulin: Secondary | ICD-10-CM

## 2019-10-18 DIAGNOSIS — E119 Type 2 diabetes mellitus without complications: Secondary | ICD-10-CM

## 2019-10-18 DIAGNOSIS — E872 Acidosis: Secondary | ICD-10-CM

## 2019-10-18 DIAGNOSIS — E1110 Type 2 diabetes mellitus with ketoacidosis without coma: Secondary | ICD-10-CM

## 2019-10-18 DIAGNOSIS — E871 Hypo-osmolality and hyponatremia: Secondary | ICD-10-CM

## 2019-10-18 DIAGNOSIS — J9601 Acute respiratory failure with hypoxia: Secondary | ICD-10-CM

## 2019-10-18 LAB — FERRITIN: Ferritin: 652 ng/mL — ABNORMAL HIGH (ref 24–336)

## 2019-10-18 LAB — CBC
HCT: 44.6 % (ref 39.0–52.0)
Hemoglobin: 14.7 g/dL (ref 13.0–17.0)
MCH: 31.5 pg (ref 26.0–34.0)
MCHC: 33 g/dL (ref 30.0–36.0)
MCV: 95.5 fL (ref 80.0–100.0)
Platelets: 535 10*3/uL — ABNORMAL HIGH (ref 150–400)
RBC: 4.67 MIL/uL (ref 4.22–5.81)
RDW: 13.8 % (ref 11.5–15.5)
WBC: 18 10*3/uL — ABNORMAL HIGH (ref 4.0–10.5)
nRBC: 0.6 % — ABNORMAL HIGH (ref 0.0–0.2)

## 2019-10-18 LAB — C-REACTIVE PROTEIN: CRP: 3.9 mg/dL — ABNORMAL HIGH (ref <1.0)

## 2019-10-18 LAB — COMPREHENSIVE METABOLIC PANEL
ALT: 24 U/L (ref 0–44)
AST: 18 U/L (ref 15–41)
Albumin: 2.4 g/dL — ABNORMAL LOW (ref 3.5–5.0)
Alkaline Phosphatase: 54 U/L (ref 38–126)
Anion gap: 12 (ref 5–15)
BUN: 26 mg/dL — ABNORMAL HIGH (ref 6–20)
CO2: 24 mmol/L (ref 22–32)
Calcium: 9 mg/dL (ref 8.9–10.3)
Chloride: 104 mmol/L (ref 98–111)
Creatinine, Ser: 1.14 mg/dL (ref 0.61–1.24)
GFR calc Af Amer: >60 mL/min (ref >60)
GFR calc non Af Amer: >60 mL/min (ref >60)
Glucose, Bld: 231 mg/dL — ABNORMAL HIGH (ref 70–99)
Potassium: 4.2 mmol/L (ref 3.5–5.1)
Sodium: 140 mmol/L (ref 135–145)
Total Bilirubin: 0.7 mg/dL (ref 0.3–1.2)
Total Protein: 6.9 g/dL (ref 6.5–8.1)

## 2019-10-18 LAB — D-DIMER, QUANTITATIVE: D-Dimer, Quant: 0.48 ug/mL-FEU (ref 0.00–0.50)

## 2019-10-18 LAB — GLUCOSE, CAPILLARY
Glucose-Capillary: 240 mg/dL — ABNORMAL HIGH (ref 70–99)
Glucose-Capillary: 217 mg/dL — ABNORMAL HIGH (ref 70–99)
Glucose-Capillary: 167 mg/dL — ABNORMAL HIGH (ref 70–99)
Glucose-Capillary: 244 mg/dL — ABNORMAL HIGH (ref 70–99)

## 2019-10-18 MED ORDER — SODIUM CHLORIDE 0.9 % IV SOLN: 100.0000 mg | Freq: Once | INTRAVENOUS | Status: DC

## 2019-10-18 MED ORDER — SODIUM CHLORIDE 0.9% FLUSH: 10.0000 mL | INTRAVENOUS | Status: DC | PRN

## 2019-10-18 MED ORDER — SODIUM CHLORIDE 0.9 % IV SOLN
100.0000 mg | Freq: Once | INTRAVENOUS | Status: AC
  Administered 2019-10-18: 100 mg via INTRAVENOUS

## 2019-10-18 MED ORDER — INSULIN DETEMIR 100 UNIT/ML ~~LOC~~ SOLN
70.0000 [IU] | Freq: Three times a day (TID) | SUBCUTANEOUS | Status: DC
  Administered 2019-10-18 – 2019-10-20 (×7): 70 [IU] via SUBCUTANEOUS
  Filled 2019-10-18 (×8): qty 0.7

## 2019-10-18 MED ORDER — SODIUM CHLORIDE 0.9% FLUSH
10.0000 mL | Freq: Two times a day (BID) | INTRAVENOUS | Status: DC
  Administered 2019-10-18: 10 mL
  Administered 2019-10-19: 20 mL
  Administered 2019-10-19 – 2019-10-23 (×8): 10 mL

## 2019-10-18 MED ORDER — CHLORHEXIDINE GLUCONATE CLOTH 2 % EX PADS
6.0000 | MEDICATED_PAD | Freq: Every day | CUTANEOUS | Status: DC
  Administered 2019-10-19 – 2019-10-22 (×4): 6 via TOPICAL

## 2019-10-18 NOTE — Progress Notes (Signed)
[facility] PROGRESS NOTE    Progress Note  ADMIRAL MARCUCCI  YQI:[id number] DOB: [birth year] DOA: 10/14/2019 PCP: Patient, No Pcp Per     Brief Narrative:   Jesse Washington is an 49 y.o. male past medical history of insulin-dependent diabetes mellitus type 2, essential hypertension morbid obesity who presented to the ED via EMS on 10/14/2019 for 1 week of worsening dyspnea, her SARS-CoV-2 PCR was positive on 10/05/2018.  She came back to the ED on 10/10/2019 was prescribed steroids and discharged home.  On arrival she was 15 L nonrebreather, with a white count of 17 creatinine of 1.6 anion gap metabolic acidosis severe hypoglycemia serum ketones, procalcitonin of 0.4 CRP of 36 D-dimer 74 chest x-ray showed bilateral infiltrates with a left effusion.  She was started on IV insulin infusion, IV remdesivir and steroids, her anion gap is closed she was transitioned to long-acting insulin acute renal failure resolved with aggressive fluid resuscitation.  Assessment/Plan:   Acute respiratory failure with hypoxia secondarily to Pneumonia due to COVID-19 virus: He is requiring 5 L of high flow nasal cannula to keep saturations greater 92%. Continue IV remdesivir and steroids, she is status post Actemra on 10/15/2019. Is inflammatory markers are significantly improved.  Continue antibiotics for a total of 7 days, as he received Actemra.  Vitamin C and zinc. Try to keep the patient even, strict I's and O's and daily weight, try to keep the patient prone for at least 16 hours a day, if not prone out of bed to chair. Continue intermediate dose Lovenox.  DKA (diabetic ketoacidoses) (HCC)/Type 2 diabetes mellitus without complication, with long-term current use of insulin (HCC) On admission she was on an insulin drip transition to long-acting insulin and sliding scale, despite this her blood glucose remained significantly elevated in the setting of steroids.  We will increase her long-acting insulin  and her meal coverage.  Acute kidney injury: With a baseline creatinine of  1 likely prerenal resolved with IV fluid hydration.  Essential hypertension: Seems well controlled continue current regimen.  Hyponatremia Pseudohyponatremia and a component of hypovolemic resolved with IV fluid hydration.  High anion gap metabolic acidosis Likely due to acute DKA now resolved.  Morbid obesity with BMI of 50.0-59.9, adult (HCC)  DVT prophylaxis: lovenox Family Communication:none Disposition Plan/Barrier to D/C: Home once he is off oxygen.  Code Status:     Code Status Orders  (From admission, onward)         Start     Ordered   10/15/19 0140  Full code  Continuous     10/15/19 0139        Code Status History    This patient has a current code status but no historical code status.   Advance Care Planning Activity        IV Access:    Peripheral IV   Procedures and diagnostic studies:   No results found.   Medical Consultants:    None.  Anti-Infectives:   IV remdesivir  Subjective:    Jesse Washington he relates he continues to feel short of breath, with no improvement compared to yesterday.  Objective:    Vitals:   10/17/19 1845 10/17/19 2000 10/18/19 0300 10/18/19 0740  BP:  137/76 137/74 (!) 151/82  Pulse:  68 (!) 55 96  Resp:  (!) 24 20 (!) 22  Temp:  97.7 F (36.5 C) 98.8 F (37.1 C)   TempSrc:  Oral Oral   SpO2: 96% 94% 97%  90%  Weight:      Height:       SpO2: 90 % O2 Flow Rate (L/min): 5 L/min   Intake/Output Summary (Last 24 hours) at 10/18/2019 0833 Last data filed at 10/18/2019 0600 Gross per 24 hour  Intake 240 ml  Output 1775 ml  Net -1535 ml   Filed Weights   10/15/19 0041  Weight: (!) 173.6 kg    Exam: General exam: In no acute distress. Respiratory system: Good air movement and clear to auscultation. Cardiovascular system: S1 & S2 heard, RRR. No JVD. Gastrointestinal system: Abdomen is nondistended, soft and  nontender.  Central nervous system: Alert and oriented. Extremities: No pedal edema. Skin: No rashes, lesions or ulcers Psychiatry: Judgement and insight appear normal. Mood & affect appropriate.    Data Reviewed:    Labs: Basic Metabolic Panel: Recent Labs  Lab 10/15/19 0420 10/15/19 0420 10/15/19 1250 10/15/19 1250 10/15/19 2230 10/16/19 0350 10/16/19 0350 10/17/19 0323 10/18/19 0345  NA 137  --  138  --   --  135  --  137 140  K 4.3   < > 5.2*   < >  --  4.9   < > 4.0 4.2  CL 102  --  105  --   --  102  --  102 104  CO2 23  --  24  --   --  23  --  25 24  GLUCOSE 264*   < > 204*  --  463* 400*  --  210* 231*  BUN 27*  --  28*  --   --  34*  --  30* 26*  CREATININE 1.20  --  1.23  --   --  1.36*  --  1.24 1.14  CALCIUM 9.1  --  9.4  --   --  9.1  --  9.1 9.0   < > = values in this interval not displayed.   GFR Estimated Creatinine Clearance: 130.2 mL/min (by C-G formula based on SCr of 1.14 mg/dL). Liver Function Tests: Recent Labs  Lab 10/14/19 1319 10/16/19 0350 10/17/19 0323 10/18/19 0345  AST 24 35 24 18  ALT 22 30 27 24   ALKPHOS 72 57 53 54  BILITOT 1.0 0.2* 0.4 0.7  PROT 8.9* 7.0 7.0 6.9  ALBUMIN 3.0* 2.3* 2.3* 2.4*   No results for input(s): LIPASE, AMYLASE in the last 168 hours. No results for input(s): AMMONIA in the last 168 hours. Coagulation profile No results for input(s): INR, PROTIME in the last 168 hours. COVID-19 Labs  Recent Labs    10/16/19 0350 10/17/19 0323 10/18/19 0345  DDIMER 0.64* <0.27 0.48  FERRITIN 1,285* 761* 652*  CRP 15.6* 8.1* 3.9*    Lab Results  Component Value Date   SARSCOV2NAA POSITIVE (A) 10/06/2019    CBC: Recent Labs  Lab 10/14/19 1319 10/18/19 0345  WBC 17.0* 18.0*  NEUTROABS 12.6*  --   HGB 16.0 14.7  HCT 48.7 44.6  MCV 95.5 95.5  PLT 358 535*   Cardiac Enzymes: No results for input(s): CKTOTAL, CKMB, CKMBINDEX, TROPONINI in the last 168 hours. BNP (last 3 results) No results for input(s):  PROBNP in the last 8760 hours. CBG: Recent Labs  Lab 10/17/19 0729 10/17/19 1147 10/17/19 1719 10/17/19 2023 10/18/19 0741  GLUCAP 236* 292* 307* 404* 240*   D-Dimer: Recent Labs    10/17/19 0323 10/18/19 0345  DDIMER <0.27 0.48   Hgb A1c: No results for input(s): HGBA1C in the last  72 hours. Lipid Profile: No results for input(s): CHOL, HDL, LDLCALC, TRIG, CHOLHDL, LDLDIRECT in the last 72 hours. Thyroid function studies: No results for input(s): TSH, T4TOTAL, T3FREE, THYROIDAB in the last 72 hours.  Invalid input(s): FREET3 Anemia work up: Recent Labs    10/17/19 0323 10/18/19 0345  FERRITIN 761* 652*   Sepsis Labs: Recent Labs  Lab 10/14/19 1319 10/18/19 0345  PROCALCITON 0.44  --   WBC 17.0* 18.0*  LATICACIDVEN 1.9  --    Microbiology Recent Results (from the past 240 hour(s))  Blood Culture (routine x 2)     Status: None (Preliminary result)   Collection Time: 10/14/19  2:05 PM   Specimen: Right Antecubital; Blood  Result Value Ref Range Status   Specimen Description   Final    RIGHT ANTECUBITAL Performed at [facility], [address], [place], [place] [address]    Special Requests   Final    BOTTLES DRAWN AEROBIC AND ANAEROBIC Blood Culture adequate volume Performed at [facility], [address], [place], [place] [address]    Culture   Final    NO GROWTH 3 DAYS Performed at [facility], [address] [place], [place] [address]    Report Status PENDING  Incomplete     Medications:   . vitamin C  500 mg Oral Daily  . azithromycin  250 mg Oral Daily  . benzonatate  100 mg Oral Q8H  . dexamethasone (DECADRON) injection  6 mg Intravenous Q0600  . enoxaparin (LOVENOX) injection  0.5 mg/kg Subcutaneous Q12H  . insulin aspart  0-20 Units Subcutaneous TID WC  . insulin aspart  0-5 Units Subcutaneous QHS  . insulin aspart  30 Units Subcutaneous TID WC  . insulin detemir  60 Units Subcutaneous TID    . Ipratropium-Albuterol  1 puff Inhalation Q6H  . zinc sulfate  220 mg Oral Daily   Continuous Infusions: . cefTRIAXone (ROCEPHIN)  IV Stopped (10/17/19 1030)  . remdesivir 100 mg in NS 100 mL Stopped (10/17/19 1115)      LOS: 4 days   Marinda Elk  [facility]  10/18/2019, 8:33 AM

## 2019-10-18 NOTE — Progress Notes (Signed)
Pt wife called back, gave update via phone

## 2019-10-18 NOTE — Progress Notes (Signed)
Called pt wife to give update, no answer. Left VM that I was calling just to give update and no acute issues and to call back if she has any questions.

## 2019-10-18 NOTE — Progress Notes (Signed)
Peripherally Inserted Central Catheter/Midline Placement  The IV Nurse has discussed with the patient and/or persons authorized to consent for the patient, the purpose of this procedure and the potential benefits and risks involved with this procedure.  The benefits include less needle sticks, lab draws from the catheter, and the patient may be discharged home with the catheter. Risks include, but not limited to, infection, bleeding, blood clot (thrombus formation), and puncture of an artery; nerve damage and irregular heartbeat and possibility to perform a PICC exchange if needed/ordered by physician.  Alternatives to this procedure were also discussed.  Bard Power PICC patient education guide, fact sheet on infection prevention and patient information card has been provided to patient /or left at bedside.    PICC/Midline Placement Documentation  PICC Single Lumen 10/18/19 PICC Right Brachial 47 cm 0 cm (Active)  Indication for Insertion or Continuance of Line Poor Vasculature-patient has had multiple peripheral attempts or PIVs lasting less than 24 hours 10/18/19 1500  Exposed Catheter (cm) 0 cm 10/18/19 1500  Site Assessment Clean;Dry;Intact 10/18/19 1500  Line Status Flushed;Saline locked;Blood return noted 10/18/19 1500  Dressing Type Transparent;Securing device 10/18/19 1500  Dressing Status Clean;Dry;Intact;Antimicrobial disc in place 10/18/19 1500  Dressing Change Due 10/25/19 10/18/19 1500       Franne Grip [place] 10/18/2019, 3:42 PM

## 2019-10-19 ENCOUNTER — Inpatient Hospital Stay: Payer: Medicaid - Out of State

## 2019-10-19 ENCOUNTER — Inpatient Hospital Stay (HOSPITAL_COMMUNITY): Payer: Medicaid - Out of State

## 2019-10-19 DIAGNOSIS — D72829 Elevated white blood cell count, unspecified: Secondary | ICD-10-CM

## 2019-10-19 LAB — COMPREHENSIVE METABOLIC PANEL
ALT: 26 U/L (ref 0–44)
AST: 27 U/L (ref 15–41)
Albumin: 2.5 g/dL — ABNORMAL LOW (ref 3.5–5.0)
Alkaline Phosphatase: 50 U/L (ref 38–126)
Anion gap: 8 (ref 5–15)
BUN: 25 mg/dL — ABNORMAL HIGH (ref 6–20)
CO2: 27 mmol/L (ref 22–32)
Calcium: 9.1 mg/dL (ref 8.9–10.3)
Chloride: 105 mmol/L (ref 98–111)
Creatinine, Ser: 1.13 mg/dL (ref 0.61–1.24)
GFR calc Af Amer: >60 mL/min (ref >60)
GFR calc non Af Amer: >60 mL/min (ref >60)
Glucose, Bld: 151 mg/dL — ABNORMAL HIGH (ref 70–99)
Potassium: 4.2 mmol/L (ref 3.5–5.1)
Sodium: 140 mmol/L (ref 135–145)
Total Bilirubin: 0.9 mg/dL (ref 0.3–1.2)
Total Protein: 6.7 g/dL (ref 6.5–8.1)

## 2019-10-19 LAB — GLUCOSE, CAPILLARY
Glucose-Capillary: 150 mg/dL — ABNORMAL HIGH (ref 70–99)
Glucose-Capillary: 330 mg/dL — ABNORMAL HIGH (ref 70–99)
Glucose-Capillary: 442 mg/dL — ABNORMAL HIGH (ref 70–99)
Glucose-Capillary: 360 mg/dL — ABNORMAL HIGH (ref 70–99)
Glucose-Capillary: 315 mg/dL — ABNORMAL HIGH (ref 70–99)
Glucose-Capillary: 300 mg/dL — ABNORMAL HIGH (ref 70–99)
Glucose-Capillary: 107 mg/dL — ABNORMAL HIGH (ref 70–99)
Glucose-Capillary: 125 mg/dL — ABNORMAL HIGH (ref 70–99)

## 2019-10-19 LAB — CULTURE, BLOOD (ROUTINE X 2)
Culture: NO GROWTH
Special Requests: ADEQUATE

## 2019-10-19 LAB — D-DIMER, QUANTITATIVE: D-Dimer, Quant: 0.55 ug/mL-FEU — ABNORMAL HIGH (ref 0.00–0.50)

## 2019-10-19 LAB — C-REACTIVE PROTEIN: CRP: 1.6 mg/dL — ABNORMAL HIGH (ref <1.0)

## 2019-10-19 LAB — FERRITIN: Ferritin: 547 ng/mL — ABNORMAL HIGH (ref 24–336)

## 2019-10-19 LAB — PROCALCITONIN: Procalcitonin: <0.1 ng/mL

## 2019-10-19 MED ORDER — INSULIN ASPART 100 UNIT/ML ~~LOC~~ SOLN
0.0000 [IU] | SUBCUTANEOUS | Status: DC
  Administered 2019-10-20: 5 [IU] via SUBCUTANEOUS
  Administered 2019-10-20: 2 [IU] via SUBCUTANEOUS

## 2019-10-19 MED ORDER — FUROSEMIDE 10 MG/ML IJ SOLN
40.0000 mg | Freq: Two times a day (BID) | INTRAMUSCULAR | Status: AC
  Administered 2019-10-19 (×2): 40 mg via INTRAVENOUS
  Filled 2019-10-19 (×2): qty 4

## 2019-10-19 MED ORDER — INSULIN REGULAR(HUMAN) IN NACL 100-0.9 UT/100ML-% IV SOLN
INTRAVENOUS | Status: DC
  Administered 2019-10-19: 24 [IU]/h via INTRAVENOUS
  Administered 2019-10-19: 30 [IU]/h via INTRAVENOUS
  Filled 2019-10-19 (×2): qty 100

## 2019-10-19 MED ORDER — DEXTROSE 50 % IV SOLN: 0.0000 mL | INTRAVENOUS | Status: DC | PRN

## 2019-10-19 MED ORDER — LORAZEPAM 2 MG/ML IJ SOLN
1.0000 mg | Freq: Once | INTRAMUSCULAR | Status: AC
  Administered 2019-10-19: 1 mg via INTRAVENOUS
  Filled 2019-10-19: qty 1

## 2019-10-19 MED ORDER — METHYLPREDNISOLONE SODIUM SUCC 40 MG IJ SOLR
40.0000 mg | Freq: Two times a day (BID) | INTRAMUSCULAR | Status: DC
  Administered 2019-10-19 – 2019-10-20 (×4): 40 mg via INTRAVENOUS
  Filled 2019-10-19 (×4): qty 1

## 2019-10-19 NOTE — Progress Notes (Signed)
[facility] PROGRESS NOTE    Progress Note  Jesse Washington  KDT:[id number] DOB: 1971/02/02 DOA: 10/14/2019 PCP: Patient, No Pcp Per     Brief Narrative:   Jesse Washington is an 49 y.o. male past medical history of insulin-dependent diabetes mellitus type 2, essential hypertension morbid obesity who presented to the ED via EMS on 10/14/2019 for 1 week of worsening dyspnea, her SARS-CoV-2 PCR was positive on 10/05/2018.  She came back to the ED on 10/10/2019 was prescribed steroids and discharged home.  On arrival she was 15 L nonrebreather, with a white count of 17 creatinine of 1.6 anion gap metabolic acidosis severe hypoglycemia serum ketones, procalcitonin of 0.4 CRP of 36 D-dimer 74 chest x-ray showed bilateral infiltrates with a left effusion.  She was started on IV insulin infusion, IV remdesivir and steroids, her anion gap is closed she was transitioned to long-acting insulin acute renal failure resolved with aggressive fluid resuscitation.  Assessment/Plan:   Acute respiratory failure with hypoxia secondarily to Pneumonia due to COVID-19 virus: He is currently requiring 11 L of high flow nasal cannula to keep saturations greater than 99%.  His oxygen requirements continue to increase I have expressed to him my concern and the need for proning and using incentive spirometry he lacks motivation. Continue IV remdesivir and steroids. He is status post Actemra on 10/15/2019. His procalcitonin was 0.44, will continue IV empiric antibiotics for total of 7 days. Continue vitamin C and zinc. His saturation has worsened, will start him on 24 hours of IV Lasix. Strict I's and O's and daily weights restrict his fluids to 1200. Try to keep the patient peripherally 16 hours a day if not prone out of bed to chair.  The patient is doing poorly proning .  DKA (diabetic ketoacidoses) (HCC)/Type 2 diabetes mellitus without complication, with long-term current use of insulin (HCC) On admission  due to his DKA he was started on insulin drip gap has closed bicarbonate is greater than 20, he was transitioned to long-acting insulin plus sliding scale. His blood glucose was persistently be elevated in the setting of steroids. We will transition his dexamethasone to Solu-Medrol.  Acute kidney injury: With a baseline creatinine of less than 1 likely prerenal, resolved with IV fluid hydration.   Essential hypertension: Blood pressure seems to be well controlled continue current regimen.  Hyponatremia Pseudohyponatremia and a component of hypovolemic resolved with IV fluid hydration.  High anion gap metabolic acidosis Likely due to acute DKA now resolved.  Leukocytosis: He is on steroids which can make his white blood cell count increased, he has remained afebrile his oxygen saturation keeps worsening. We will check a procalcitonin and CT scan of the chest.  Morbid obesity with BMI of 50.0-59.9, adult (HCC)  DVT prophylaxis: lovenox Family Communication:none Disposition Plan/Barrier to D/C: Home once he is off oxygen.  Code Status:     Code Status Orders  (From admission, onward)         Start     Ordered   10/15/19 0140  Full code  Continuous     10/15/19 0139        Code Status History    This patient has a current code status but no historical code status.   Advance Care Planning Activity        IV Access:    Peripheral IV   Procedures and diagnostic studies:   DG CHEST PORT 1 VIEW  Result Date: 10/18/2019 CLINICAL DATA:  Central line placement.  EXAM: PORTABLE CHEST 1 VIEW COMPARISON:  Chest x-ray dated October 14, 2019. FINDINGS: New right upper extremity PICC line with the tip in the lower right atrium. Stable cardiomegaly. Increasing bilateral pleural effusions and bilateral mid and lower lung airspace disease. No pneumothorax. No acute osseous abnormality. IMPRESSION: 1. Right upper extremity PICC line with tip in the low right atrium. Recommend  retracting 6 cm. 2. Worsening bilateral airspace disease and pleural effusions. Electronically Signed   By: Obie Dredge M.D.   On: 10/18/2019 16:12   [place] EKG SITE RITE  Result Date: 10/18/2019 If Site Rite image not attached, placement could not be confirmed due to current cardiac rhythm.    Medical Consultants:    None.  Anti-Infectives:   IV remdesivir  Subjective:    Jesse Washington he relates he continues to feel short of breath, with no improvement compared to yesterday.  Objective:    Vitals:   10/18/19 1631 10/18/19 2007 10/18/19 2341 10/19/19 0403  BP: 131/88 (!) 164/75 139/68 126/89  Pulse: 77 (!) 55 (!) 56 (!) 58  Resp: 16 20 18 20   Temp: 98.6 F (37 C) 98.3 F (36.8 C) 98.3 F (36.8 C) 98.6 F (37 C)  TempSrc: Oral Oral Oral Oral  SpO2: 97% 97% 100% 100%  Weight:      Height:       SpO2: 100 % O2 Flow Rate (L/min): 11 L/min   Intake/Output Summary (Last 24 hours) at 10/19/2019 0742 Last data filed at 10/18/2019 2343 Gross per 24 hour  Intake 120 ml  Output 300 ml  Net -180 ml   Filed Weights   10/15/19 0041  Weight: (!) 173.6 kg    Exam: General exam: In no acute distress. Respiratory system: Good air movement and clear to auscultation. Cardiovascular system: S1 & S2 heard, RRR. No JVD. Gastrointestinal system: Abdomen is nondistended, soft and nontender.  Central nervous system: Alert and oriented. Extremities: No pedal edema. Skin: No rashes, lesions or ulcers Psychiatry: Judgement and insight appear normal. Mood & affect appropriate.    Data Reviewed:    Labs: Basic Metabolic Panel: Recent Labs  Lab 10/15/19 0420 10/15/19 0420 10/15/19 1250 10/15/19 1250 10/15/19 2230 10/16/19 0350 10/16/19 0350 10/17/19 0323 10/18/19 0345  NA 137  --  138  --   --  135  --  137 140  K 4.3   < > 5.2*   < >  --  4.9   < > 4.0 4.2  CL 102  --  105  --   --  102  --  102 104  CO2 23  --  24  --   --  23  --  25 24  GLUCOSE 264*   <  > 204*  --  463* 400*  --  210* 231*  BUN 27*  --  28*  --   --  34*  --  30* 26*  CREATININE 1.20  --  1.23  --   --  1.36*  --  1.24 1.14  CALCIUM 9.1  --  9.4  --   --  9.1  --  9.1 9.0   < > = values in this interval not displayed.   GFR Estimated Creatinine Clearance: 130.2 mL/min (by C-G formula based on SCr of 1.14 mg/dL). Liver Function Tests: Recent Labs  Lab 10/14/19 1319 10/16/19 0350 10/17/19 0323 10/18/19 0345  AST 24 35 24 18  ALT 22 30 27 24   ALKPHOS 72 57  53 54  BILITOT 1.0 0.2* 0.4 0.7  PROT 8.9* 7.0 7.0 6.9  ALBUMIN 3.0* 2.3* 2.3* 2.4*   No results for input(s): LIPASE, AMYLASE in the last 168 hours. No results for input(s): AMMONIA in the last 168 hours. Coagulation profile No results for input(s): INR, PROTIME in the last 168 hours. COVID-19 Labs  Recent Labs    10/17/19 0323 10/18/19 0345  DDIMER <0.27 0.48  FERRITIN 761* 652*  CRP 8.1* 3.9*    Lab Results  Component Value Date   SARSCOV2NAA POSITIVE (A) 10/06/2019    CBC: Recent Labs  Lab 10/14/19 1319 10/18/19 0345  WBC 17.0* 18.0*  NEUTROABS 12.6*  --   HGB 16.0 14.7  HCT 48.7 44.6  MCV 95.5 95.5  PLT 358 535*   Cardiac Enzymes: No results for input(s): CKTOTAL, CKMB, CKMBINDEX, TROPONINI in the last 168 hours. BNP (last 3 results) No results for input(s): PROBNP in the last 8760 hours. CBG: Recent Labs  Lab 10/17/19 2023 10/18/19 0741 10/18/19 1159 10/18/19 1629 10/18/19 2006  GLUCAP 404* 240* 217* 167* 244*   D-Dimer: Recent Labs    10/17/19 0323 10/18/19 0345  DDIMER <0.27 0.48   Hgb A1c: No results for input(s): HGBA1C in the last 72 hours. Lipid Profile: No results for input(s): CHOL, HDL, LDLCALC, TRIG, CHOLHDL, LDLDIRECT in the last 72 hours. Thyroid function studies: No results for input(s): TSH, T4TOTAL, T3FREE, THYROIDAB in the last 72 hours.  Invalid input(s): FREET3 Anemia work up: Recent Labs    10/17/19 0323 10/18/19 0345  FERRITIN 761* 652*     Sepsis Labs: Recent Labs  Lab 10/14/19 1319 10/18/19 0345  PROCALCITON 0.44  --   WBC 17.0* 18.0*  LATICACIDVEN 1.9  --    Microbiology Recent Results (from the past 240 hour(s))  Blood Culture (routine x 2)     Status: None (Preliminary result)   Collection Time: 10/14/19  2:05 PM   Specimen: Right Antecubital; Blood  Result Value Ref Range Status   Specimen Description   Final    RIGHT ANTECUBITAL Performed at [facility], [place] [address], [place], [place] [address]    Special Requests   Final    BOTTLES DRAWN AEROBIC AND ANAEROBIC Blood Culture adequate volume Performed at [place] [address] [place], [place] [address]    Culture   Final    NO GROWTH 4 DAYS Performed at [facility], [place] [address] [place], [place] [address]    Report Status PENDING  Incomplete     Medications:   . vitamin C  500 mg Oral Daily  . azithromycin  250 mg Oral Daily  . benzonatate  100 mg Oral Q8H  . Chlorhexidine Gluconate Cloth  6 each Topical Daily  . dexamethasone (DECADRON) injection  6 mg Intravenous Q0600  . enoxaparin (LOVENOX) injection  0.5 mg/kg Subcutaneous Q12H  . insulin aspart  0-20 Units Subcutaneous TID WC  . insulin aspart  0-5 Units Subcutaneous QHS  . insulin aspart  30 Units Subcutaneous TID WC  . insulin detemir  70 Units Subcutaneous TID  . Ipratropium-Albuterol  1 puff Inhalation Q6H  . sodium chloride flush  10-40 mL Intracatheter Q12H  . zinc sulfate  220 mg Oral Daily   Continuous Infusions: . cefTRIAXone (ROCEPHIN)  IV 2 g (10/18/19 0951)  . remdesivir 100 mg in NS 100 mL Stopped (10/17/19 1115)  . remdesivir 100 mg in NS 100 mL Stopped (10/18/19 1500)  LOS: 5 days   Marinda Elk  [facility]  10/19/2019, 7:42 AM

## 2019-10-20 LAB — BASIC METABOLIC PANEL
Anion gap: 15 (ref 5–15)
BUN: 32 mg/dL — ABNORMAL HIGH (ref 6–20)
CO2: 25 mmol/L (ref 22–32)
Calcium: 9.3 mg/dL (ref 8.9–10.3)
Chloride: 98 mmol/L (ref 98–111)
Creatinine, Ser: 1.35 mg/dL — ABNORMAL HIGH (ref 0.61–1.24)
GFR calc Af Amer: >60 mL/min (ref >60)
GFR calc non Af Amer: >60 mL/min (ref >60)
Glucose, Bld: 177 mg/dL — ABNORMAL HIGH (ref 70–99)
Potassium: 4.3 mmol/L (ref 3.5–5.1)
Sodium: 138 mmol/L (ref 135–145)

## 2019-10-20 LAB — GLUCOSE, CAPILLARY
Glucose-Capillary: 150 mg/dL — ABNORMAL HIGH (ref 70–99)
Glucose-Capillary: 216 mg/dL — ABNORMAL HIGH (ref 70–99)
Glucose-Capillary: 227 mg/dL — ABNORMAL HIGH (ref 70–99)
Glucose-Capillary: 235 mg/dL — ABNORMAL HIGH (ref 70–99)
Glucose-Capillary: 238 mg/dL — ABNORMAL HIGH (ref 70–99)
Glucose-Capillary: 267 mg/dL — ABNORMAL HIGH (ref 70–99)
Glucose-Capillary: 349 mg/dL — ABNORMAL HIGH (ref 70–99)

## 2019-10-20 LAB — CBC WITH DIFFERENTIAL/PLATELET
Abs Immature Granulocytes: 0.75 10*3/uL — ABNORMAL HIGH (ref 0.00–0.07)
Basophils Absolute: 0.1 10*3/uL (ref 0.0–0.1)
Basophils Relative: 0 %
Eosinophils Absolute: 0 10*3/uL (ref 0.0–0.5)
Eosinophils Relative: 0 %
HCT: 47.8 % (ref 39.0–52.0)
Hemoglobin: 15.5 g/dL (ref 13.0–17.0)
Immature Granulocytes: 2 %
Lymphocytes Relative: 5 %
Lymphs Abs: 1.7 10*3/uL (ref 0.7–4.0)
MCH: 31.6 pg (ref 26.0–34.0)
MCHC: 32.4 g/dL (ref 30.0–36.0)
MCV: 97.6 fL (ref 80.0–100.0)
Monocytes Absolute: 1 10*3/uL (ref 0.1–1.0)
Monocytes Relative: 3 %
Neutro Abs: 28.3 10*3/uL — ABNORMAL HIGH (ref 1.7–7.7)
Neutrophils Relative %: 90 %
Platelets: 582 10*3/uL — ABNORMAL HIGH (ref 150–400)
RBC: 4.9 MIL/uL (ref 4.22–5.81)
RDW: 13.6 % (ref 11.5–15.5)
WBC: 31.8 10*3/uL — ABNORMAL HIGH (ref 4.0–10.5)
nRBC: 0.1 % (ref 0.0–0.2)

## 2019-10-20 LAB — D-DIMER, QUANTITATIVE: D-Dimer, Quant: 0.45 ug/mL-FEU (ref 0.00–0.50)

## 2019-10-20 LAB — C-REACTIVE PROTEIN: CRP: 1.1 mg/dL — ABNORMAL HIGH (ref <1.0)

## 2019-10-20 LAB — PROCALCITONIN: Procalcitonin: <0.1 ng/mL

## 2019-10-20 MED ORDER — INSULIN ASPART 100 UNIT/ML ~~LOC~~ SOLN
0.0000 [IU] | Freq: Three times a day (TID) | SUBCUTANEOUS | Status: DC
  Administered 2019-10-20: 7 [IU] via SUBCUTANEOUS
  Administered 2019-10-20: 15 [IU] via SUBCUTANEOUS
  Administered 2019-10-20: 7 [IU] via SUBCUTANEOUS
  Administered 2019-10-21: 15 [IU] via SUBCUTANEOUS
  Administered 2019-10-21 (×2): 7 [IU] via SUBCUTANEOUS
  Administered 2019-10-22: 3 [IU] via SUBCUTANEOUS
  Administered 2019-10-22: 4 [IU] via SUBCUTANEOUS
  Administered 2019-10-22: 3 [IU] via SUBCUTANEOUS

## 2019-10-20 MED ORDER — INSULIN ASPART 100 UNIT/ML ~~LOC~~ SOLN
0.0000 [IU] | Freq: Every day | SUBCUTANEOUS | Status: DC
  Administered 2019-10-20: 3 [IU] via SUBCUTANEOUS
  Administered 2019-10-21: 2 [IU] via SUBCUTANEOUS

## 2019-10-20 MED ORDER — INSULIN ASPART 100 UNIT/ML ~~LOC~~ SOLN: 6.0000 [IU] | Freq: Three times a day (TID) | SUBCUTANEOUS | Status: DC

## 2019-10-20 MED ORDER — FUROSEMIDE 10 MG/ML IJ SOLN
40.0000 mg | Freq: Two times a day (BID) | INTRAMUSCULAR | Status: AC
  Administered 2019-10-20 (×2): 40 mg via INTRAVENOUS
  Filled 2019-10-20 (×2): qty 4

## 2019-10-20 MED ORDER — INSULIN ASPART 100 UNIT/ML ~~LOC~~ SOLN
30.0000 [IU] | Freq: Three times a day (TID) | SUBCUTANEOUS | Status: DC
  Administered 2019-10-20: 30 [IU] via SUBCUTANEOUS

## 2019-10-20 MED ORDER — INSULIN ASPART 100 UNIT/ML ~~LOC~~ SOLN
35.0000 [IU] | Freq: Three times a day (TID) | SUBCUTANEOUS | Status: DC
  Administered 2019-10-20 – 2019-10-22 (×6): 35 [IU] via SUBCUTANEOUS

## 2019-10-20 MED ORDER — INSULIN DETEMIR 100 UNIT/ML ~~LOC~~ SOLN
80.0000 [IU] | Freq: Three times a day (TID) | SUBCUTANEOUS | Status: DC
  Administered 2019-10-20 – 2019-10-21 (×5): 80 [IU] via SUBCUTANEOUS
  Filled 2019-10-20 (×7): qty 0.8

## 2019-10-20 NOTE — Evaluation (Signed)
Physical Therapy Evaluation Patient Details Name: Jesse Washington MRN: [id number] DOB: [birth year] Today's Date: 10/20/2019   History of Present Illness  49 y.o. male with medical history significant of type 2 diabetes mellitus, essential hypertension, asthma, morbid obesity who he presents to the ED with progressive shortness of breath over the last week.  He was recently seen on 10/10/2019, after being diagnosed with Covid-19 on 10/06/2019 with shortness of breath.  At that time he was oxygenating well on room air and discharged home on prednisone and antitussives.  Since discharge from the ED, he has had progressive symptoms and was found to be significantly hypoxic on EMS arrival, requiring 15 L per NRB.  Clinical Impression   Pt admitted with above diagnosis. Pt from [place] state was visit family here when his entire family caught COVID. PTA states was living home with family and was very independent, he was unable to work d/t hernia and herniated disc in back. Pt currently with functional limitations due to the deficits listed below (see PT Problem List). This am pt is very independent with mobility needing stand by assist and line management to complete ambulation x approx 91ft in room. Pt was on 8L/min vai HFNC and sats in high 90s throughout, at end of session pt titrated to 6L/min via HFNC and sats remained in 90s. Pt has been educated on breathing exercises and incentive spirometer use and also flutter valve, has been instructed on thera-band exercises and also it<>stand exercises to complete throughout day. Pt will benefit from skilled PT to increase their independence and safety with mobility to allow discharge to the venue listed below.       Follow Up Recommendations No PT follow up    Equipment Recommendations  None recommended by PT    Recommendations for Other Services OT consult     Precautions / Restrictions Precautions Precautions: None Restrictions Weight Bearing  Restrictions: No      Mobility  Bed Mobility               General bed mobility comments: pt sitting in recliner at therapis arrival  Transfers Overall transfer level: Modified independent Equipment used: None                Ambulation/Gait Ambulation/Gait assistance: Supervision;Modified independent (Device/Increase time) Gait Distance (Feet): 80 Feet Assistive device: None Gait Pattern/deviations: WFL(Within Functional Limits) Gait velocity: functional   General Gait Details: gait was functional, pt was on 8L/min via HFNC and sats >95% througout ambulation  Stairs            Wheelchair Mobility    Modified Rankin (Stroke Patients Only)       Balance Overall balance assessment: No apparent balance deficits (not formally assessed)                                           Pertinent Vitals/Pain Pain Assessment: No/denies pain    Home Living Family/patient expects to be discharged to:: Private residence Living Arrangements: Spouse/significant other;Children Available Help at Discharge: Family Type of Home: House Home Access: Stairs to enter Entrance Stairs-Rails: Left Entrance Stairs-Number of Steps: 5 Home Layout: Two level Home Equipment: Environmental consultant - 2 wheels;Cane - single point      Prior Function Level of Independence: Independent         Comments: states was not working d/t hernia and herniated discs, pt from  [place] was in [place] to visit family when entire family got sick     Hand Dominance        Extremity/Trunk Assessment   Upper Extremity Assessment Upper Extremity Assessment: Overall WFL for tasks assessed    Lower Extremity Assessment Lower Extremity Assessment: Overall WFL for tasks assessed       Communication   Communication: No difficulties  Cognition Arousal/Alertness: Awake/alert Behavior During Therapy: WFL for tasks assessed/performed Overall Cognitive Status: Within Functional Limits for tasks  assessed                                        General Comments      Exercises Other Exercises Other Exercises: flutter valve x 5 Other Exercises: incentive spirometer x 5 pulls 780ml   Assessment/Plan    PT Assessment Patient needs continued PT services  PT Problem List Decreased activity tolerance       PT Treatment Interventions Gait training;Stair training;Functional mobility training;Therapeutic activities;Therapeutic exercise;Neuromuscular re-education;Balance training;Patient/family education    PT Goals (Current goals can be found in the Care Plan section)  Acute Rehab PT Goals Patient Stated Goal: to get back to [place] PT Goal Formulation: With patient Time For Goal Achievement: 11/03/19 Potential to Achieve Goals: Good    Frequency Min 3X/week   Barriers to discharge        Co-evaluation               AM-PAC PT "6 Clicks" Mobility  Outcome Measure Help needed turning from your back to your side while in a flat bed without using bedrails?: None Help needed moving from lying on your back to sitting on the side of a flat bed without using bedrails?: None Help needed moving to and from a bed to a chair (including a wheelchair)?: A Little Help needed standing up from a chair using your arms (e.g., wheelchair or bedside chair)?: None Help needed to walk in hospital room?: A Little Help needed climbing 3-5 steps with a railing? : A Little 6 Click Score: 21    End of Session Equipment Utilized During Treatment: Oxygen Activity Tolerance: Patient tolerated treatment well Patient left: in chair;with call bell/phone within reach   PT Visit Diagnosis: Other abnormalities of gait and mobility (R26.89)    Time: 9518-8416 PT Time Calculation (min) (ACUTE ONLY): 18 min   Charges:   PT Evaluation $PT Eval Moderate Complexity: [place], PT   Delford Field 10/20/2019, 1:47 PM

## 2019-10-20 NOTE — Progress Notes (Signed)
[facility] PROGRESS NOTE    Progress Note  Jesse Washington  QIH:[id number] DOB: 28-Jan-1971 DOA: 10/14/2019 PCP: Patient, No Pcp Per     Brief Narrative:   Jesse Washington is an 49 y.o. male past medical history of insulin-dependent diabetes mellitus type 2, essential hypertension morbid obesity who presented to the ED via EMS on 10/14/2019 for 1 week of worsening dyspnea, her SARS-CoV-2 PCR was positive on 10/05/2018.  She came back to the ED on 10/10/2019 was prescribed steroids and discharged home.  On arrival she was 15 L nonrebreather, with a white count of 17 creatinine of 1.6 anion gap metabolic acidosis severe hypoglycemia serum ketones, procalcitonin of 0.4 CRP of 36 D-dimer 74 chest x-ray showed bilateral infiltrates with a left effusion.  She was started on IV insulin infusion, IV remdesivir and steroids, her anion gap is closed she was transitioned to long-acting insulin acute renal failure resolved with aggressive fluid resuscitation.  Assessment/Plan:   Acute respiratory failure with hypoxia secondarily to Pneumonia due to COVID-19 virus: Jesse Washington is requiring 9 L of oxygen to keep saturations greater than 94%, I have expressed to him the importance of proning and using incentive spirometry, but he does lack motivation. Completed his course of IV remdesivir, will continue IV steroids. He is status post Actemra on 10/15/2019, his procalcitonin was mildly elevated to 0.4 on admission, continue empiric antibiotics Rocephin and azithromycin for total of 7 days, continue vitamin C and zinc. He was started on IV Lasix for 24 hours and he is now even, his basic metabolic panel is pending this morning. We will continue Lasix for an additional 24 hours.  His blood pressure is stable. Continue strict I's and O's and daily weights, try to keep the patient prone for at least 16 hours a day, if not prone out of bed to chair, the patient is adamant to proning. We will restrict his  fluids.  DKA (diabetic ketoacidoses) (HCC)/Type 2 diabetes mellitus without complication, with long-term current use of insulin (HCC) On admission due to his DKA he was started on insulin drip gap has closed bicarbonate is greater than 20, he was transitioned to long-acting insulin plus sliding scale. Blood glucose became erratic and significantly elevated likely due to steroids, his dexamethasone was changed to Solu-Medrol. He was started on IV insulin drip his blood glucose is in proved, will transition him to long-acting insulin plus resistant sliding scale.  Acute kidney injury: With a baseline creatinine of less than 1 likely prerenal, resolved with IV fluid hydration.   Essential hypertension: Blood pressure seems to be well controlled continue current regimen.  Hyponatremia Pseudohyponatremia and a component of hypovolemic resolved with IV fluid hydration.  High anion gap metabolic acidosis Likely due to acute DKA now resolved.  Leukocytosis: He is on steroids which can make his white blood cell count increased, he has remained afebrile his oxygen saturation keeps worsening. His procalcitonin is less than 0.1, D-dimer is unremarkable. CT of the chest is pending.  Morbid obesity with BMI of 50.0-59.9, adult (HCC)  DVT prophylaxis: lovenox Family Communication:none Disposition Plan/Barrier to D/C: Home once he is off oxygen.  Code Status:     Code Status Orders  (From admission, onward)         Start     Ordered   10/15/19 0140  Full code  Continuous     10/15/19 0139        Code Status History    This patient has a  current code status but no historical code status.   Advance Care Planning Activity        IV Access:    Peripheral IV   Procedures and diagnostic studies:   CT CHEST WO CONTRAST  Result Date: 10/19/2019 CLINICAL DATA:  Dyspnea on exertion, diabetes mellitus, hypertension, former smoker, COVID positive EXAM: CT CHEST WITHOUT CONTRAST  TECHNIQUE: Multidetector CT imaging of the chest was performed following the standard protocol without IV contrast. Sagittal and coronal MPR images reconstructed from axial data set. COMPARISON:  10/08/2019 FINDINGS: Cardiovascular: Aorta normal caliber. RIGHT arm PICC line tip at cavoatrial junction. Heart unremarkable. No pericardial effusion. Mediastinum/Nodes: Esophagus normal appearance. No thoracic adenopathy. Scattered normal size mediastinal lymph nodes. Base of cervical region unremarkable. Lungs/Pleura: Patchy infiltrates in both lungs, peripheral predominance, consistent with multifocal pneumonia, greater in lower lobes. No pleural effusion or pneumothorax. Lung assessment degraded by respiratory motion. Upper Abdomen: Unremarkable Musculoskeletal: Scattered endplate spur formation thoracic spine. IMPRESSION: Patchy infiltrates in both lungs consistent with multifocal pneumonia, greater in lower lobes. Electronically Signed   By: Lavonia Dana M.D.   On: 10/19/2019 13:24   DG CHEST PORT 1 VIEW  Result Date: 10/18/2019 CLINICAL DATA:  Central line placement. EXAM: PORTABLE CHEST 1 VIEW COMPARISON:  Chest x-ray dated October 14, 2019. FINDINGS: New right upper extremity PICC line with the tip in the lower right atrium. Stable cardiomegaly. Increasing bilateral pleural effusions and bilateral mid and lower lung airspace disease. No pneumothorax. No acute osseous abnormality. IMPRESSION: 1. Right upper extremity PICC line with tip in the low right atrium. Recommend retracting 6 cm. 2. Worsening bilateral airspace disease and pleural effusions. Electronically Signed   By: Titus Dubin M.D.   On: 10/18/2019 16:12   [place] EKG SITE RITE  Result Date: 10/18/2019 If Site Rite image not attached, placement could not be confirmed due to current cardiac rhythm.    Medical Consultants:    None.  Anti-Infectives:   IV remdesivir  Subjective:    Jesse Washington he relates he continues to feel  short of breath unchanged compared to yesterday.  Objective:    Vitals:   10/19/19 1609 10/19/19 1958 10/19/19 2358 10/20/19 0423  BP: 107/60 113/66 109/64 128/68  Pulse: 68 85 64 (!) 58  Resp: 20 20 16 16   Temp: 98.6 F (37 C) 98.5 F (36.9 C) 98.4 F (36.9 C) 98.6 F (37 C)  TempSrc: Oral Oral Oral Oral  SpO2: 96% 96% 95% 99%  Weight:      Height:       SpO2: 99 % O2 Flow Rate (L/min): 9 L/min   Intake/Output Summary (Last 24 hours) at 10/20/2019 0734 Last data filed at 10/19/2019 2159 Gross per 24 hour  Intake 940 ml  Output 3060 ml  Net -2120 ml   Filed Weights   10/15/19 0041  Weight: (!) 173.6 kg    Exam: General exam: In no acute distress, morbidly obese Respiratory system: has good chest wall movement cannot appreciate any air movement due to his body habitus. Cardiovascular system: S1 & S2 heard, RRR. No JVD. Gastrointestinal system: Abdomen is nondistended, soft and nontender.  Extremities: No pedal edema. Skin: No rashes, lesions or ulcers Psychiatry: Judgement and insight appear normal. Mood & affect appropriate.   Data Reviewed:    Labs: Basic Metabolic Panel: Recent Labs  Lab 10/15/19 1250 10/15/19 1250 10/15/19 2230 10/16/19 0350 10/16/19 0350 10/17/19 0323 10/17/19 0323 10/18/19 0345 10/19/19 0651  NA 138  --   --  135  --  137  --  140 140  K 5.2*   < >  --  4.9   < > 4.0   < > 4.2 4.2  CL 105  --   --  102  --  102  --  104 105  CO2 24  --   --  23  --  25  --  24 27  GLUCOSE 204*   < > 463* 400*  --  210*  --  231* 151*  BUN 28*  --   --  34*  --  30*  --  26* 25*  CREATININE 1.23  --   --  1.36*  --  1.24  --  1.14 1.13  CALCIUM 9.4  --   --  9.1  --  9.1  --  9.0 9.1   < > = values in this interval not displayed.   GFR Estimated Creatinine Clearance: 131.3 mL/min (by C-G formula based on SCr of 1.13 mg/dL). Liver Function Tests: Recent Labs  Lab 10/14/19 1319 10/16/19 0350 10/17/19 0323 10/18/19 0345 10/19/19 0651   AST 24 35 24 18 27   ALT 22 30 27 24 26   ALKPHOS 72 57 53 54 50  BILITOT 1.0 0.2* 0.4 0.7 0.9  PROT 8.9* 7.0 7.0 6.9 6.7  ALBUMIN 3.0* 2.3* 2.3* 2.4* 2.5*   No results for input(s): LIPASE, AMYLASE in the last 168 hours. No results for input(s): AMMONIA in the last 168 hours. Coagulation profile No results for input(s): INR, PROTIME in the last 168 hours. COVID-19 Labs  Recent Labs    10/18/19 0345 10/19/19 0651 10/20/19 0247  DDIMER 0.48 0.55* 0.45  FERRITIN 652* 547*  --   CRP 3.9* 1.6* 1.1*    Lab Results  Component Value Date   SARSCOV2NAA POSITIVE (A) 10/06/2019    CBC: Recent Labs  Lab 10/14/19 1319 10/18/19 0345  WBC 17.0* 18.0*  NEUTROABS 12.6*  --   HGB 16.0 14.7  HCT 48.7 44.6  MCV 95.5 95.5  PLT 358 535*   Cardiac Enzymes: No results for input(s): CKTOTAL, CKMB, CKMBINDEX, TROPONINI in the last 168 hours. BNP (last 3 results) No results for input(s): PROBNP in the last 8760 hours. CBG: Recent Labs  Lab 10/19/19 2049 10/19/19 2154 10/19/19 2309 10/19/19 2357 10/20/19 0520  GLUCAP 300* 107* 125* 150* 216*   D-Dimer: Recent Labs    10/19/19 0651 10/20/19 0247  DDIMER 0.55* 0.45   Hgb A1c: No results for input(s): HGBA1C in the last 72 hours. Lipid Profile: No results for input(s): CHOL, HDL, LDLCALC, TRIG, CHOLHDL, LDLDIRECT in the last 72 hours. Thyroid function studies: No results for input(s): TSH, T4TOTAL, T3FREE, THYROIDAB in the last 72 hours.  Invalid input(s): FREET3 Anemia work up: Recent Labs    10/18/19 0345 10/19/19 0651  FERRITIN 652* 547*   Sepsis Labs: Recent Labs  Lab 10/14/19 1319 10/18/19 0345 10/19/19 0651 10/20/19 0247  PROCALCITON 0.44  --  <0.10 <0.10  WBC 17.0* 18.0*  --   --   LATICACIDVEN 1.9  --   --   --    Microbiology Recent Results (from the past 240 hour(s))  Blood Culture (routine x 2)     Status: None   Collection Time: 10/14/19  2:05 PM   Specimen: Right Antecubital; Blood  Result  Value Ref Range Status   Specimen Description   Final    RIGHT ANTECUBITAL Performed at [facility], [address]  [place], [place] [address]    Special Requests   Final    BOTTLES DRAWN AEROBIC AND ANAEROBIC Blood Culture adequate volume Performed at [facility], [address], [place], [place] [address]    Culture   Final    NO GROWTH 5 DAYS Performed at [facility], [address], [place], [place] [address]    Report Status 10/19/2019 FINAL  Final     Medications:   . vitamin C  500 mg Oral Daily  . azithromycin  250 mg Oral Daily  . benzonatate  100 mg Oral Q8H  . Chlorhexidine Gluconate Cloth  6 each Topical Daily  . enoxaparin (LOVENOX) injection  0.5 mg/kg Subcutaneous Q12H  . insulin aspart  0-15 Units Subcutaneous Q4H  . insulin detemir  70 Units Subcutaneous TID  . Ipratropium-Albuterol  1 puff Inhalation Q6H  . methylPREDNISolone (SOLU-MEDROL) injection  40 mg Intravenous Q12H  . sodium chloride flush  10-40 mL Intracatheter Q12H  . zinc sulfate  220 mg Oral Daily   Continuous Infusions: . cefTRIAXone (ROCEPHIN)  IV Stopped (10/19/19 1313)      LOS: 6 days   Marinda Elk  [facility]  10/20/2019, 7:34 AM

## 2019-10-21 LAB — BASIC METABOLIC PANEL
Anion gap: 13 (ref 5–15)
BUN: 39 mg/dL — ABNORMAL HIGH (ref 6–20)
CO2: 30 mmol/L (ref 22–32)
Calcium: 9.5 mg/dL (ref 8.9–10.3)
Chloride: 95 mmol/L — ABNORMAL LOW (ref 98–111)
Creatinine, Ser: 1.38 mg/dL — ABNORMAL HIGH (ref 0.61–1.24)
GFR calc Af Amer: >60 mL/min (ref >60)
GFR calc non Af Amer: 60 mL/min — ABNORMAL LOW (ref >60)
Glucose, Bld: 193 mg/dL — ABNORMAL HIGH (ref 70–99)
Potassium: 4.5 mmol/L (ref 3.5–5.1)
Sodium: 138 mmol/L (ref 135–145)

## 2019-10-21 LAB — CBC WITH DIFFERENTIAL/PLATELET
Abs Immature Granulocytes: 0.66 10*3/uL — ABNORMAL HIGH (ref 0.00–0.07)
Basophils Absolute: 0.1 10*3/uL (ref 0.0–0.1)
Basophils Relative: 0 %
Eosinophils Absolute: 0 10*3/uL (ref 0.0–0.5)
Eosinophils Relative: 0 %
HCT: 47.4 % (ref 39.0–52.0)
Hemoglobin: 15.3 g/dL (ref 13.0–17.0)
Immature Granulocytes: 2 %
Lymphocytes Relative: 5 %
Lymphs Abs: 1.6 10*3/uL (ref 0.7–4.0)
MCH: 30.9 pg (ref 26.0–34.0)
MCHC: 32.3 g/dL (ref 30.0–36.0)
MCV: 95.8 fL (ref 80.0–100.0)
Monocytes Absolute: 1.3 10*3/uL — ABNORMAL HIGH (ref 0.1–1.0)
Monocytes Relative: 4 %
Neutro Abs: 26 10*3/uL — ABNORMAL HIGH (ref 1.7–7.7)
Neutrophils Relative %: 89 %
Platelets: 604 10*3/uL — ABNORMAL HIGH (ref 150–400)
RBC: 4.95 MIL/uL (ref 4.22–5.81)
RDW: 13.4 % (ref 11.5–15.5)
WBC: 29.6 10*3/uL — ABNORMAL HIGH (ref 4.0–10.5)
nRBC: 0.1 % (ref 0.0–0.2)

## 2019-10-21 LAB — GLUCOSE, CAPILLARY
Glucose-Capillary: 206 mg/dL — ABNORMAL HIGH (ref 70–99)
Glucose-Capillary: 211 mg/dL — ABNORMAL HIGH (ref 70–99)
Glucose-Capillary: 216 mg/dL — ABNORMAL HIGH (ref 70–99)
Glucose-Capillary: 250 mg/dL — ABNORMAL HIGH (ref 70–99)

## 2019-10-21 LAB — PROCALCITONIN: Procalcitonin: <0.1 ng/mL

## 2019-10-21 MED ORDER — SODIUM CHLORIDE 0.9 % IV SOLN: 100.0000 mg | Freq: Every day | INTRAVENOUS | Status: DC

## 2019-10-21 MED ORDER — METHYLPREDNISOLONE SODIUM SUCC 40 MG IJ SOLR
10.0000 mg | Freq: Two times a day (BID) | INTRAMUSCULAR | Status: DC
  Administered 2019-10-21: 10 mg via INTRAVENOUS
  Filled 2019-10-21: qty 1

## 2019-10-21 MED ORDER — SODIUM CHLORIDE 0.9 % IV SOLN: 200.0000 mg | Freq: Once | INTRAVENOUS | Status: DC

## 2019-10-21 MED ORDER — METHYLPREDNISOLONE SODIUM SUCC 40 MG IJ SOLR
20.0000 mg | Freq: Two times a day (BID) | INTRAMUSCULAR | Status: DC
  Administered 2019-10-21: 20 mg via INTRAVENOUS
  Filled 2019-10-21: qty 1

## 2019-10-21 NOTE — Plan of Care (Signed)
Pt A&Ox4. VSS, SpO2 > 88% on RA. Bgs controlled w/ sliding scale insulin & long acting insulin. No c/o pain throughout shift. Utilizing urinal w/ adequate output   PT OOBTC for 2hrs, tolerated well.   Bathed & gown change  SW contacted per pt request. Had questions about financial issues when d/c. Given the office number [phone]) to contact on Monday since CM/SW are working remotely.   Attempted ambulation down hallway w/ 2L [place]. Sats remained >88% but pt fatigued during walk & felt SOB. Ambulated 172ft. Tolerated fair   All safety measures in place, report given to [place] 3rd floor RN. Will transport pt to unit w/ all belongings & meds.  Berneice Heinrich    Problem: Education: Goal: Knowledge of risk factors and measures for prevention of condition will improve Outcome: Progressing   Problem: Coping: Goal: Psychosocial and spiritual needs will be supported Outcome: Progressing   Problem: Respiratory: Goal: Will maintain a patent airway Outcome: Progressing Goal: Complications related to the disease process, condition or treatment will be avoided or minimized Outcome: Progressing   Problem: Education: Goal: Knowledge of General Education information will improve Description: Including pain rating scale, medication(s)/side effects and non-pharmacologic comfort measures Outcome: Progressing   Problem: Health Behavior/Discharge Planning: Goal: Ability to manage health-related needs will improve Outcome: Progressing   Problem: Clinical Measurements: Goal: Ability to maintain clinical measurements within normal limits will improve Outcome: Progressing Goal: Will remain free from infection Outcome: Progressing Goal: Diagnostic test results will improve Outcome: Progressing Goal: Respiratory complications will improve Outcome: Progressing Goal: Cardiovascular complication will be avoided Outcome: Progressing   Problem: Activity: Goal: Risk for activity intolerance  will decrease Outcome: Progressing   Problem: Nutrition: Goal: Adequate nutrition will be maintained Outcome: Progressing   Problem: Coping: Goal: Level of anxiety will decrease Outcome: Progressing   Problem: Elimination: Goal: Will not experience complications related to bowel motility Outcome: Progressing Goal: Will not experience complications related to urinary retention Outcome: Progressing   Problem: Pain Managment: Goal: General experience of comfort will improve Outcome: Progressing   Problem: Safety: Goal: Ability to remain free from injury will improve Outcome: Progressing   Problem: Skin Integrity: Goal: Risk for impaired skin integrity will decrease Outcome: Progressing

## 2019-10-21 NOTE — Progress Notes (Signed)
[facility] PROGRESS NOTE    Progress Note  Jesse Washington  HYI:[id number] DOB: 04/10/1971 DOA: 10/14/2019 PCP: Patient, No Pcp Per     Brief Narrative:   Jesse Washington is an 49 y.o. male past medical history of insulin-dependent diabetes mellitus type 2, essential hypertension morbid obesity who presented to the ED via EMS on 10/14/2019 for 1 week of worsening dyspnea, her SARS-CoV-2 PCR was positive on 10/05/2018.  She came back to the ED on 10/10/2019 was prescribed steroids and discharged home.  On arrival she was 15 L nonrebreather, with a white count of 17 creatinine of 1.6 anion gap metabolic acidosis severe hypoglycemia serum ketones, procalcitonin of 0.4 CRP of 36 D-dimer 74 chest x-ray showed bilateral infiltrates with a left effusion.  She was started on IV insulin infusion, IV remdesivir and steroids, her anion gap is closed she was transitioned to long-acting insulin acute renal failure resolved with aggressive fluid resuscitation.  Assessment/Plan:   Acute respiratory failure with hypoxia secondarily to Pneumonia due to COVID-19 virus: Jesse Washington this morning is requiring 1 L of oxygen to keep saturations greater than 94%, he has been doing an excellent job trying to prone incentive spirometry and flutter valve. Has completed his course of IV remdesivir, he was given 2 doses of IV Lasix and is negative about 2 L with excellent response and we have been able to wean him down to 1 L of oxygen. We will continue to start titrating down his steroids.  Has doses of Actemra, his procalcitonin was mildly elevated on admission, so we will continue Rocephin and azithromycin for a total of 7 days, continue vitamin C and zinc. Try to keep the patient prone for about 16 hours a day if not prone out of bed to chair, continue incentive spirometry and flutter valve. Can be transferred to third floor.  We will start to wean his steroids down.  DKA (diabetic ketoacidoses) (HCC)/Type 2  diabetes mellitus without complication, with long-term current use of insulin (HCC) On admission due to his DKA he was started on insulin drip anion gap has closed bicarbonate is greater than 20 was transitioned to long-acting insulin and sliding scale, his blood sugar has been hard to control likely due to his steroids which were weaning down continue current regimen. Hopefully with weaning of the steroids his blood glucose will be easier to control.  Acute kidney injury: With a baseline creatinine of less than 1 likely prerenal, resolved with IV fluid hydration.   Essential hypertension: Blood pressure seems to be well controlled continue current regimen. Hyponatremia Pseudohyponatremia and a component of hypovolemic resolved with IV fluid hydration.  High anion gap metabolic acidosis Likely due to acute DKA now resolved.  Leukocytosis: Has remained afebrile with his leukocytosis marginally improved there is likely due to steroids CT scan did not show any new infiltrates.  Procalcitonin was less than 1 D-dimer was unremarkable.  Morbid obesity with BMI of 50.0-59.9, adult (HCC)  DVT prophylaxis: lovenox Family Communication:none Disposition Plan/Barrier to D/C: Home once he is off oxygen.  Code Status:     Code Status Orders  (From admission, onward)         Start     Ordered   10/15/19 0140  Full code  Continuous     10/15/19 0139        Code Status History    This patient has a current code status but no historical code status.   Advance Care Planning Activity  IV Access:    Peripheral IV   Procedures and diagnostic studies:   CT CHEST WO CONTRAST  Result Date: 10/19/2019 CLINICAL DATA:  Dyspnea on exertion, diabetes mellitus, hypertension, former smoker, COVID positive EXAM: CT CHEST WITHOUT CONTRAST TECHNIQUE: Multidetector CT imaging of the chest was performed following the standard protocol without IV contrast. Sagittal and coronal MPR images  reconstructed from axial data set. COMPARISON:  10/08/2019 FINDINGS: Cardiovascular: Aorta normal caliber. RIGHT arm PICC line tip at cavoatrial junction. Heart unremarkable. No pericardial effusion. Mediastinum/Nodes: Esophagus normal appearance. No thoracic adenopathy. Scattered normal size mediastinal lymph nodes. Base of cervical region unremarkable. Lungs/Pleura: Patchy infiltrates in both lungs, peripheral predominance, consistent with multifocal pneumonia, greater in lower lobes. No pleural effusion or pneumothorax. Lung assessment degraded by respiratory motion. Upper Abdomen: Unremarkable Musculoskeletal: Scattered endplate spur formation thoracic spine. IMPRESSION: Patchy infiltrates in both lungs consistent with multifocal pneumonia, greater in lower lobes. Electronically Signed   By: Ulyses Southward M.D.   On: 10/19/2019 13:24     Medical Consultants:    None.  Anti-Infectives:   IV remdesivir  Subjective:    Jesse Washington relates that his breathing is significantly improved compared to yesterday he can really tell the difference today.  Objective:    Vitals:   10/20/19 2100 10/21/19 0000 10/21/19 0325 10/21/19 0605  BP:  122/73 122/76   Pulse:  65 78   Resp: 19 14 15 14   Temp:  98.5 F (36.9 C) 99.1 F (37.3 C)   TempSrc:  Oral Oral   SpO2: 96% 98% 96% 95%  Weight:      Height:       SpO2: 95 % O2 Flow Rate (L/min): 1 L/min   Intake/Output Summary (Last 24 hours) at 10/21/2019 0723 Last data filed at 10/21/2019 0339 Gross per 24 hour  Intake 240 ml  Output 2500 ml  Net -2260 ml   Filed Weights   10/15/19 0041  Weight: (!) 173.6 kg    Exam: General exam: In no acute distress. Respiratory system: Good air movement and clear to auscultation. Cardiovascular system: S1 & S2 heard, RRR. No JVD. Gastrointestinal system: Abdomen is nondistended, soft and nontender.  Extremities: No pedal edema. Skin: No rashes, lesions or ulcers  Data Reviewed:     Labs: Basic Metabolic Panel: Recent Labs  Lab 10/17/19 0323 10/17/19 0323 10/18/19 0345 10/18/19 0345 10/19/19 10/21/19 10/19/19 0651 10/20/19 0247 10/21/19 0346  NA 137  --  140  --  140  --  138 138  K 4.0   < > 4.2   < > 4.2   < > 4.3 4.5  CL 102  --  104  --  105  --  98 95*  CO2 25  --  24  --  27  --  25 30  GLUCOSE 210*  --  231*  --  151*  --  177* 193*  BUN 30*  --  26*  --  25*  --  32* 39*  CREATININE 1.24  --  1.14  --  1.13  --  1.35* 1.38*  CALCIUM 9.1  --  9.0  --  9.1  --  9.3 9.5   < > = values in this interval not displayed.   GFR Estimated Creatinine Clearance: 107.5 mL/min (A) (by C-G formula based on SCr of 1.38 mg/dL (H)). Liver Function Tests: Recent Labs  Lab 10/14/19 1319 10/16/19 0350 10/17/19 0323 10/18/19 0345 10/19/19 0651  AST 24 35 24  18 27  ALT 22 30 27 24 26   ALKPHOS 72 57 53 54 50  BILITOT 1.0 0.2* 0.4 0.7 0.9  PROT 8.9* 7.0 7.0 6.9 6.7  ALBUMIN 3.0* 2.3* 2.3* 2.4* 2.5*   No results for input(s): LIPASE, AMYLASE in the last 168 hours. No results for input(s): AMMONIA in the last 168 hours. Coagulation profile No results for input(s): INR, PROTIME in the last 168 hours. COVID-19 Labs  Recent Labs    10/19/19 0651 10/20/19 0247  DDIMER 0.55* 0.45  FERRITIN 547*  --   CRP 1.6* 1.1*    Lab Results  Component Value Date   SARSCOV2NAA POSITIVE (A) 10/06/2019    CBC: Recent Labs  Lab 10/14/19 1319 10/18/19 0345 10/20/19 0247 10/21/19 0346  WBC 17.0* 18.0* 31.8* 29.6*  NEUTROABS 12.6*  --  28.3* 26.0*  HGB 16.0 14.7 15.5 15.3  HCT 48.7 44.6 47.8 47.4  MCV 95.5 95.5 97.6 95.8  PLT 358 535* 582* 604*   Cardiac Enzymes: No results for input(s): CKTOTAL, CKMB, CKMBINDEX, TROPONINI in the last 168 hours. BNP (last 3 results) No results for input(s): PROBNP in the last 8760 hours. CBG: Recent Labs  Lab 10/20/19 1158 10/20/19 1633 10/20/19 2028 10/21/19 0004 10/21/19 0324  GLUCAP 227* 349* 267* 216* 211*    D-Dimer: Recent Labs    10/19/19 0651 10/20/19 0247  DDIMER 0.55* 0.45   Hgb A1c: No results for input(s): HGBA1C in the last 72 hours. Lipid Profile: No results for input(s): CHOL, HDL, LDLCALC, TRIG, CHOLHDL, LDLDIRECT in the last 72 hours. Thyroid function studies: No results for input(s): TSH, T4TOTAL, T3FREE, THYROIDAB in the last 72 hours.  Invalid input(s): FREET3 Anemia work up: Recent Labs    10/19/19 0651  FERRITIN 547*   Sepsis Labs: Recent Labs  Lab 10/14/19 1319 10/18/19 0345 10/19/19 0651 10/20/19 0247 10/21/19 0346  PROCALCITON 0.44  --  <0.10 <0.10 <0.10  WBC 17.0* 18.0*  --  31.8* 29.6*  LATICACIDVEN 1.9  --   --   --   --    Microbiology Recent Results (from the past 240 hour(s))  Blood Culture (routine x 2)     Status: None   Collection Time: 10/14/19  2:05 PM   Specimen: Right Antecubital; Blood  Result Value Ref Range Status   Specimen Description   Final    RIGHT ANTECUBITAL Performed at [facility], [address], [place], [place]    Special Requests   Final    BOTTLES DRAWN AEROBIC AND ANAEROBIC Blood Culture adequate volume Performed at [facility], [address] [place], [place]    Culture   Final    NO GROWTH 5 DAYS Performed at [facility], [address] [place], [place]    Report Status 10/19/2019 FINAL  Final     Medications:   . vitamin C  500 mg Oral Daily  . benzonatate  100 mg Oral Q8H  . Chlorhexidine Gluconate Cloth  6 each Topical Daily  . enoxaparin (LOVENOX) injection  0.5 mg/kg Subcutaneous Q12H  . insulin aspart  0-20 Units Subcutaneous TID WC  . insulin aspart  0-5 Units Subcutaneous QHS  . insulin aspart  35 Units Subcutaneous TID WC  . insulin detemir  80 Units Subcutaneous TID  . Ipratropium-Albuterol  1 puff Inhalation Q6H  . methylPREDNISolone (SOLU-MEDROL) injection  40 mg Intravenous Q12H  . sodium chloride flush   10-40 mL Intracatheter Q12H  .  zinc sulfate  220 mg Oral Daily   Continuous Infusions:     LOS: 7 days   Charlynne Cousins  [facility]  10/21/2019, 7:23 AM

## 2019-10-22 DIAGNOSIS — J9601 Acute respiratory failure with hypoxia: Secondary | ICD-10-CM

## 2019-10-22 LAB — BASIC METABOLIC PANEL
Anion gap: 11 (ref 5–15)
BUN: 40 mg/dL — ABNORMAL HIGH (ref 6–20)
CO2: 30 mmol/L (ref 22–32)
Calcium: 9.1 mg/dL (ref 8.9–10.3)
Chloride: 95 mmol/L — ABNORMAL LOW (ref 98–111)
Creatinine, Ser: 1.39 mg/dL — ABNORMAL HIGH (ref 0.61–1.24)
GFR calc Af Amer: >60 mL/min (ref >60)
GFR calc non Af Amer: 59 mL/min — ABNORMAL LOW (ref >60)
Glucose, Bld: 280 mg/dL — ABNORMAL HIGH (ref 70–99)
Potassium: 4.9 mmol/L (ref 3.5–5.1)
Sodium: 136 mmol/L (ref 135–145)

## 2019-10-22 LAB — CBC WITH DIFFERENTIAL/PLATELET
Abs Immature Granulocytes: 0.71 10*3/uL — ABNORMAL HIGH (ref 0.00–0.07)
Basophils Absolute: 0.1 10*3/uL (ref 0.0–0.1)
Basophils Relative: 0 %
Eosinophils Absolute: 0 10*3/uL (ref 0.0–0.5)
Eosinophils Relative: 0 %
HCT: 44.8 % (ref 39.0–52.0)
Hemoglobin: 14.6 g/dL (ref 13.0–17.0)
Immature Granulocytes: 3 %
Lymphocytes Relative: 8 %
Lymphs Abs: 1.9 10*3/uL (ref 0.7–4.0)
MCH: 31.1 pg (ref 26.0–34.0)
MCHC: 32.6 g/dL (ref 30.0–36.0)
MCV: 95.5 fL (ref 80.0–100.0)
Monocytes Absolute: 1 10*3/uL (ref 0.1–1.0)
Monocytes Relative: 4 %
Neutro Abs: 19.7 10*3/uL — ABNORMAL HIGH (ref 1.7–7.7)
Neutrophils Relative %: 85 %
Platelets: 532 10*3/uL — ABNORMAL HIGH (ref 150–400)
RBC: 4.69 MIL/uL (ref 4.22–5.81)
RDW: 13.3 % (ref 11.5–15.5)
WBC: 23.3 10*3/uL — ABNORMAL HIGH (ref 4.0–10.5)
nRBC: 0.1 % (ref 0.0–0.2)

## 2019-10-22 LAB — GLUCOSE, CAPILLARY
Glucose-Capillary: 238 mg/dL — ABNORMAL HIGH (ref 70–99)
Glucose-Capillary: 193 mg/dL — ABNORMAL HIGH (ref 70–99)
Glucose-Capillary: 199 mg/dL — ABNORMAL HIGH (ref 70–99)
Glucose-Capillary: 138 mg/dL — ABNORMAL HIGH (ref 70–99)

## 2019-10-22 MED ORDER — INSULIN DETEMIR 100 UNIT/ML ~~LOC~~ SOLN
85.0000 [IU] | Freq: Three times a day (TID) | SUBCUTANEOUS | Status: DC
  Administered 2019-10-22 (×3): 85 [IU] via SUBCUTANEOUS
  Administered 2019-10-23: 42.5 [IU] via SUBCUTANEOUS
  Filled 2019-10-22 (×6): qty 0.85

## 2019-10-22 MED ORDER — METHYLPREDNISOLONE SODIUM SUCC 40 MG IJ SOLR
10.0000 mg | Freq: Every day | INTRAMUSCULAR | Status: DC
  Administered 2019-10-22: 10 mg via INTRAVENOUS
  Filled 2019-10-22: qty 1

## 2019-10-22 MED ORDER — INSULIN ASPART 100 UNIT/ML ~~LOC~~ SOLN
40.0000 [IU] | Freq: Three times a day (TID) | SUBCUTANEOUS | Status: DC
  Administered 2019-10-22 (×2): 40 [IU] via SUBCUTANEOUS

## 2019-10-22 NOTE — Progress Notes (Signed)
[facility] PROGRESS NOTE    Progress Note  KALIQ LEGE  ZOX:[id number] DOB: 11-Mar-1971 DOA: 10/14/2019 PCP: Patient, No Pcp Per     Brief Narrative:   DEMARIE HYNEMAN is an 49 y.o. male past medical history of insulin-dependent diabetes mellitus type 2, essential hypertension morbid obesity who presented to the ED via EMS on 10/14/2019 for 1 week of worsening dyspnea, her SARS-CoV-2 PCR was positive on 10/05/2018.  She came back to the ED on 10/10/2019 was prescribed steroids and discharged home.  On arrival she was 15 L nonrebreather, with a white count of 17 creatinine of 1.6 anion gap metabolic acidosis severe hypoglycemia serum ketones, procalcitonin of 0.4 CRP of 36 D-dimer 74 chest x-ray showed bilateral infiltrates with a left effusion.  She was started on IV insulin infusion, IV remdesivir and steroids, her anion gap is closed she was transitioned to long-acting insulin acute renal failure resolved with aggressive fluid resuscitation.  Assessment/Plan:   Acute respiratory failure with hypoxia secondarily to Pneumonia due to COVID-19 virus: Mr. Faircloth this morning is on room air satting greater 93%, he has been doing an excellent job using the flutter valve and incentive spirometry. He has completed course of IV remdesivir, will wean him down on steroids. He is status post Actemra his procalcitonin was mildly elevated on admission. He completed his course of IV antibiotics, continue vitamin C and zinc. We will watch him for an additional 24 hours.  DKA (diabetic ketoacidoses) (HCC)/Type 2 diabetes mellitus without complication, with long-term current use of insulin (HCC) On admission he was started on IV insulin drip for his DKA his blood glucose has been hard to control likely due to steroids which were weaned down. Continue long-acting insulin plus sliding scale.  Acute kidney injury: Likely prerenal resolved with IV fluid hydration.  Essential  hypertension: Blood pressure seems to be well controlled continue current regimen.  Hyponatremia Pseudohyponatremia and a component of hypovolemic resolved with IV fluid hydration.  High anion gap metabolic acidosis Likely due to acute DKA now resolved.  Leukocytosis: He has remained afebrile with no leukocytosis in the setting of steroids, his procalcitonin was less than 1 his D-dimer was unremarkable.  CT scan of the chest did not show any new infiltrates.  Morbid obesity with BMI of 50.0-59.9, adult (HCC)  DVT prophylaxis: lovenox Family Communication:none Disposition Plan/Barrier to D/C: Home hopefully tomorrow morning. Code Status:     Code Status Orders  (From admission, onward)         Start     Ordered   10/15/19 0140  Full code  Continuous     10/15/19 0139        Code Status History    This patient has a current code status but no historical code status.   Advance Care Planning Activity        IV Access:    Peripheral IV   Procedures and diagnostic studies:   No results found.   Medical Consultants:    None.  Anti-Infectives:   IV remdesivir  Subjective:    Mardene Celeste Wagar relates his breathing is back to baseline.  Objective:    Vitals:   10/21/19 1432 10/21/19 2000 10/22/19 0500 10/22/19 0749  BP: 115/68 137/76 104/60 121/67  Pulse: 96 98 65 66  Resp: 18   18  Temp: 98 F (36.7 C) 98.2 F (36.8 C) 98.6 F (37 C) 98.1 F (36.7 C)  TempSrc: Oral Oral Oral Oral  SpO2: 92% 98%  93% 92%  Weight:      Height:       SpO2: 92 % O2 Flow Rate (L/min): 2 L/min   Intake/Output Summary (Last 24 hours) at 10/22/2019 0811 Last data filed at 10/22/2019 0500 Gross per 24 hour  Intake 240 ml  Output 850 ml  Net -610 ml   Filed Weights   10/15/19 0041  Weight: (!) 173.6 kg    Exam: General exam: In no acute distress. Respiratory system: Good air movement and clear to auscultation. Cardiovascular system: S1 & S2 heard, RRR.  No JVD. Gastrointestinal system: Abdomen is nondistended, soft and nontender.  Extremities: No pedal edema. Skin: No rashes, lesions or ulcers  Data Reviewed:    Labs: Basic Metabolic Panel: Recent Labs  Lab 10/18/19 0345 10/18/19 0345 10/19/19 2992 10/19/19 4268 10/20/19 0247 10/20/19 0247 10/21/19 0346 10/22/19 0354  NA 140  --  140  --  138  --  138 136  K 4.2   < > 4.2   < > 4.3   < > 4.5 4.9  CL 104  --  105  --  98  --  95* 95*  CO2 24  --  27  --  25  --  30 30  GLUCOSE 231*  --  151*  --  177*  --  193* 280*  BUN 26*  --  25*  --  32*  --  39* 40*  CREATININE 1.14  --  1.13  --  1.35*  --  1.38* 1.39*  CALCIUM 9.0  --  9.1  --  9.3  --  9.5 9.1   < > = values in this interval not displayed.   GFR Estimated Creatinine Clearance: 106.7 mL/min (A) (by C-G formula based on SCr of 1.39 mg/dL (H)). Liver Function Tests: Recent Labs  Lab 10/16/19 0350 10/17/19 0323 10/18/19 0345 10/19/19 0651  AST 35 24 18 27   ALT 30 27 24 26   ALKPHOS 57 53 54 50  BILITOT 0.2* 0.4 0.7 0.9  PROT 7.0 7.0 6.9 6.7  ALBUMIN 2.3* 2.3* 2.4* 2.5*   No results for input(s): LIPASE, AMYLASE in the last 168 hours. No results for input(s): AMMONIA in the last 168 hours. Coagulation profile No results for input(s): INR, PROTIME in the last 168 hours. COVID-19 Labs  Recent Labs    10/20/19 0247  DDIMER 0.45  CRP 1.1*    Lab Results  Component Value Date   SARSCOV2NAA POSITIVE (A) 10/06/2019    CBC: Recent Labs  Lab 10/18/19 0345 10/20/19 0247 10/21/19 0346 10/22/19 0354  WBC 18.0* 31.8* 29.6* 23.3*  NEUTROABS  --  28.3* 26.0* 19.7*  HGB 14.7 15.5 15.3 14.6  HCT 44.6 47.8 47.4 44.8  MCV 95.5 97.6 95.8 95.5  PLT 535* 582* 604* 532*   Cardiac Enzymes: No results for input(s): CKTOTAL, CKMB, CKMBINDEX, TROPONINI in the last 168 hours. BNP (last 3 results) No results for input(s): PROBNP in the last 8760 hours. CBG: Recent Labs  Lab 10/21/19 0004 10/21/19 0324  10/21/19 0739 10/21/19 1706 10/22/19 0515  GLUCAP 216* 211* 206* 250* 238*   D-Dimer: Recent Labs    10/20/19 0247  DDIMER 0.45   Hgb A1c: No results for input(s): HGBA1C in the last 72 hours. Lipid Profile: No results for input(s): CHOL, HDL, LDLCALC, TRIG, CHOLHDL, LDLDIRECT in the last 72 hours. Thyroid function studies: No results for input(s): TSH, T4TOTAL, T3FREE, THYROIDAB in the last 72 hours.  Invalid input(s): FREET3 Anemia  work up: No results for input(s): VITAMINB12, FOLATE, FERRITIN, TIBC, IRON, RETICCTPCT in the last 72 hours. Sepsis Labs: Recent Labs  Lab 10/18/19 0345 10/19/19 0651 10/20/19 0247 10/21/19 0346 10/22/19 0354  PROCALCITON  --  <0.10 <0.10 <0.10  --   WBC 18.0*  --  31.8* 29.6* 23.3*   Microbiology Recent Results (from the past 240 hour(s))  Blood Culture (routine x 2)     Status: None   Collection Time: 10/14/19  2:05 PM   Specimen: Right Antecubital; Blood  Result Value Ref Range Status   Specimen Description   Final    RIGHT ANTECUBITAL Performed at [facility], [address], [place], [place] [address]    Special Requests   Final    BOTTLES DRAWN AEROBIC AND ANAEROBIC Blood Culture adequate volume Performed at [facility], [address] [place], [place] [address]    Culture   Final    NO GROWTH 5 DAYS Performed at [facility], [address], [place], [place] [address]    Report Status 10/19/2019 FINAL  Final     Medications:   . vitamin C  500 mg Oral Daily  . benzonatate  100 mg Oral Q8H  . Chlorhexidine Gluconate Cloth  6 each Topical Daily  . enoxaparin (LOVENOX) injection  0.5 mg/kg Subcutaneous Q12H  . insulin aspart  0-20 Units Subcutaneous TID WC  . insulin aspart  0-5 Units Subcutaneous QHS  . insulin aspart  35 Units Subcutaneous TID WC  . insulin detemir  80 Units Subcutaneous TID  . Ipratropium-Albuterol  1 puff Inhalation Q6H  . methylPREDNISolone  (SOLU-MEDROL) injection  10 mg Intravenous Q12H  . sodium chloride flush  10-40 mL Intracatheter Q12H  . zinc sulfate  220 mg Oral Daily   Continuous Infusions:     LOS: 8 days   Marinda Elk  [facility]  10/22/2019, 8:11 AM

## 2019-10-23 LAB — BASIC METABOLIC PANEL
Anion gap: 11 (ref 5–15)
BUN: 40 mg/dL — ABNORMAL HIGH (ref 6–20)
CO2: 30 mmol/L (ref 22–32)
Calcium: 9.2 mg/dL (ref 8.9–10.3)
Chloride: 99 mmol/L (ref 98–111)
Creatinine, Ser: 1.35 mg/dL — ABNORMAL HIGH (ref 0.61–1.24)
GFR calc Af Amer: >60 mL/min (ref >60)
GFR calc non Af Amer: >60 mL/min (ref >60)
Glucose, Bld: 95 mg/dL (ref 70–99)
Potassium: 3.8 mmol/L (ref 3.5–5.1)
Sodium: 140 mmol/L (ref 135–145)

## 2019-10-23 LAB — GLUCOSE, CAPILLARY
Glucose-Capillary: 163 mg/dL — ABNORMAL HIGH (ref 70–99)
Glucose-Capillary: 121 mg/dL — ABNORMAL HIGH (ref 70–99)
Glucose-Capillary: 86 mg/dL (ref 70–99)
Glucose-Capillary: 122 mg/dL — ABNORMAL HIGH (ref 70–99)
Glucose-Capillary: 92 mg/dL (ref 70–99)
Glucose-Capillary: 101 mg/dL — ABNORMAL HIGH (ref 70–99)
Glucose-Capillary: 198 mg/dL — ABNORMAL HIGH (ref 70–99)
Glucose-Capillary: 203 mg/dL — ABNORMAL HIGH (ref 70–99)
Glucose-Capillary: 302 mg/dL — ABNORMAL HIGH (ref 70–99)

## 2019-10-23 LAB — CBC WITH DIFFERENTIAL/PLATELET
Abs Immature Granulocytes: 0.35 10*3/uL — ABNORMAL HIGH (ref 0.00–0.07)
Basophils Absolute: 0.1 10*3/uL (ref 0.0–0.1)
Basophils Relative: 0 %
Eosinophils Absolute: 0 10*3/uL (ref 0.0–0.5)
Eosinophils Relative: 0 %
HCT: 47.3 % (ref 39.0–52.0)
Hemoglobin: 15.3 g/dL (ref 13.0–17.0)
Immature Granulocytes: 2 %
Lymphocytes Relative: 26 %
Lymphs Abs: 4.1 10*3/uL — ABNORMAL HIGH (ref 0.7–4.0)
MCH: 31 pg (ref 26.0–34.0)
MCHC: 32.3 g/dL (ref 30.0–36.0)
MCV: 95.7 fL (ref 80.0–100.0)
Monocytes Absolute: 1.2 10*3/uL — ABNORMAL HIGH (ref 0.1–1.0)
Monocytes Relative: 7 %
Neutro Abs: 10 10*3/uL — ABNORMAL HIGH (ref 1.7–7.7)
Neutrophils Relative %: 65 %
Platelets: 485 10*3/uL — ABNORMAL HIGH (ref 150–400)
RBC: 4.94 MIL/uL (ref 4.22–5.81)
RDW: 13.4 % (ref 11.5–15.5)
WBC: 15.7 10*3/uL — ABNORMAL HIGH (ref 4.0–10.5)
nRBC: 0.1 % (ref 0.0–0.2)

## 2019-10-23 MED ORDER — BLOOD GLUCOSE MONITOR KIT: PACK | 0 refills | Status: AC

## 2019-10-23 MED ORDER — PREDNISONE 5 MG PO TABS: 5.0000 mg | ORAL_TABLET | Freq: Every day | ORAL | 0 refills | Status: AC

## 2019-10-23 MED ORDER — PREDNISONE 5 MG PO TABS
5.0000 mg | ORAL_TABLET | Freq: Every day | ORAL | Status: DC
  Administered 2019-10-23: 5 mg via ORAL
  Filled 2019-10-23 (×2): qty 1

## 2019-10-23 MED ORDER — MUPIROCIN CALCIUM 2 % EX CREA
TOPICAL_CREAM | Freq: Once | CUTANEOUS | Status: DC
  Filled 2019-10-23: qty 15

## 2019-10-23 NOTE — Discharge Summary (Signed)
Physician Discharge Summary  Jesse Washington PJS:[id number] DOB: [birth year] DOA: 10/14/2019  PCP: Patient, No Pcp Per  Admit date: 10/14/2019 Discharge date: 10/23/2019  Admitted From: Home Disposition:  Home  Recommendations for Outpatient Follow-up:  1. Follow up with PCP in 1-2 weeks  Home Health:no Equipment/Devices:None  Discharge Condition:Stable CODE STATUS:Full Diet recommendation: Heart Healthy   Brief/Interim Summary: 49 y.o. male past medical history of insulin-dependent diabetes mellitus type 2, essential hypertension morbid obesity who presented to the ED via EMS on 10/14/2019 for 1 week of worsening dyspnea, her SARS-CoV-2 PCR was positive on 10/05/2018.  She came back to the ED on 10/10/2019 was prescribed steroids and discharged home.  On arrival she was 15 L nonrebreather, with a white count of 17 creatinine of 1.6 anion gap metabolic acidosis severe hypoglycemia serum ketones, procalcitonin of 0.4 CRP of 36 D-dimer 74 chest x-ray showed bilateral infiltrates with a left effusion.  Discharge Diagnoses:  Principal Problem:   Pneumonia due to COVID-19 virus Active Problems:   DKA (diabetic ketoacidoses) ([place])   Type 2 diabetes mellitus without complication, with long-term current use of insulin ([facility])   Hyponatremia   High anion gap metabolic acidosis   Acute respiratory failure with hypoxia (H[facility])   Morbid obesity with BMI of 50.0-59.9, adult (HCC)   Asthma   Leukocytosis Acute respiratory failure with hypoxia secondarily to pneumonia due to COVID-19: Mr. Jesse Washington was started on supplemental oxygen especially high flow, IV remdesivir and steroids he is status post 1 dose of IV Actemra, due to his significant elevated white count elevated procalcitonin he was started empirically on IV antibiotics. He completed his course of IV remdesivir in house, also completed his course of IV antibiotics in house. He will wean to room air, which he tolerated well. We will continue his  steroid taper as an outpatient.  DKA/diabetes mellitus type 2 with long-term use of insulin: On admission he had to place on IV insulin due to DKA his anion gap closed his bicarb became greater than 20. Then he was transitioned to long-acting insulin and sliding scale.  Acute kidney injury: Likely prerenal, resolved with IV fluid hydration.  Essential hypertension: Blood pressure is well controlled continue current regimen.  Hyponatremia: Likely a component of pseudohyponatremia and hypovolemic resolved with IV fluid hydration and correction of his blood glucose.  High anion gap metabolic acidosis: Noted likely due to DKA resolved.  Leukocytosis: He remained afebrile procalcitonin is low he was covered with a 7-day course of IV antibiotics CT scan was repeated did not show any infiltrates likely due to steroids.   Discharge Instructions  Discharge Instructions    Diet - low sodium heart healthy   Complete by: As directed    Diet - low sodium heart healthy   Complete by: As directed    Increase activity slowly   Complete by: As directed    Increase activity slowly   Complete by: As directed    MyChart COVID-19 home monitoring program   Complete by: Oct 23, 2019    Is the patient willing to use the [facility] for home monitoring?: Yes   Temperature monitoring   Complete by: Oct 23, 2019    After how many days would you like to receive a notification of this patient's flowsheet entries?: 1     Allergies as of 10/23/2019      Reactions   Ciprofloxacin Rash      Medication List    STOP taking these medications  azithromycin 250 MG tablet Commonly known as: Zithromax Z-Pak     TAKE these medications   acetaminophen 500 MG tablet Commonly known as: TYLENOL Take 1 tablet (500 mg total) by mouth every 6 (six) hours as needed. What changed: reasons to take this   albuterol 108 (90 Base) MCG/ACT inhaler Commonly known as: VENTOLIN HFA Inhale 2 puffs into the  lungs every 6 (six) hours as needed for shortness of breath.   Basaglar KwikPen 100 UNIT/ML Sopn Inject 0.2 mLs (20 Units total) into the skin 2 (two) times daily.   benzonatate 100 MG capsule Commonly known as: TESSALON Take 1 capsule (100 mg total) by mouth every 8 (eight) hours.   blood glucose meter kit and supplies Kit Dispense based on patient and insurance preference. Use up to four times daily as directed. (FOR ICD-9 250.00, 250.01).   budesonide-formoterol 160-4.5 MCG/ACT inhaler Commonly known as: SYMBICORT Inhale 2 puffs into the lungs 2 (two) times daily.   insulin lispro 100 UNIT/ML injection Commonly known as: HUMALOG Per your known sliding scale   predniSONE 5 MG tablet Commonly known as: DELTASONE Take 1 tablet (5 mg total) by mouth daily with breakfast. Start taking on: October 24, 2019 What changed:   medication strength  how much to take  when to take this   promethazine-dextromethorphan 6.25-15 MG/5ML syrup Commonly known as: PROMETHAZINE-DM Take 2.5 mLs by mouth 4 (four) times daily as needed for cough.       Allergies  Allergen Reactions  . Ciprofloxacin Rash    Consultations:  none   Procedures/Studies: DG Chest 2 View  Result Date: 10/06/2019 CLINICAL DATA:  Cough and pain.  Hypertension. EXAM: CHEST - 2 VIEW COMPARISON:  None. FINDINGS: Lungs are clear. Heart size and pulmonary vascularity are normal. No adenopathy. No pneumothorax. There is degenerative change in the thoracic spine. IMPRESSION: Lungs clear.  Cardiac silhouette normal.  No adenopathy. Electronically Signed   By: Lowella Grip III M.D.   On: 10/06/2019 14:28   CT CHEST WO CONTRAST  Result Date: 10/19/2019 CLINICAL DATA:  Dyspnea on exertion, diabetes mellitus, hypertension, former smoker, COVID positive EXAM: CT CHEST WITHOUT CONTRAST TECHNIQUE: Multidetector CT imaging of the chest was performed following the standard protocol without IV contrast. Sagittal and  coronal MPR images reconstructed from axial data set. COMPARISON:  10/08/2019 FINDINGS: Cardiovascular: Aorta normal caliber. RIGHT arm PICC line tip at cavoatrial junction. Heart unremarkable. No pericardial effusion. Mediastinum/Nodes: Esophagus normal appearance. No thoracic adenopathy. Scattered normal size mediastinal lymph nodes. Base of cervical region unremarkable. Lungs/Pleura: Patchy infiltrates in both lungs, peripheral predominance, consistent with multifocal pneumonia, greater in lower lobes. No pleural effusion or pneumothorax. Lung assessment degraded by respiratory motion. Upper Abdomen: Unremarkable Musculoskeletal: Scattered endplate spur formation thoracic spine. IMPRESSION: Patchy infiltrates in both lungs consistent with multifocal pneumonia, greater in lower lobes. Electronically Signed   By: Lavonia Dana M.D.   On: 10/19/2019 13:24   CT Angio Chest PE W and/or Wo Contrast  Result Date: 10/08/2019 CLINICAL DATA:  Shortness of breath. COVID positive yesterday. Cough and body aches. EXAM: CT ANGIOGRAPHY CHEST WITH CONTRAST TECHNIQUE: Multidetector CT imaging of the chest was performed using the standard protocol during bolus administration of intravenous contrast. Multiplanar CT image reconstructions and MIPs were obtained to evaluate the vascular anatomy. CONTRAST:  141m OMNIPAQUE IOHEXOL 350 MG/ML SOLN COMPARISON:  Radiograph yesterday. Radiograph 10/06/2019 FINDINGS: Cardiovascular: Significantly limited evaluation for pulmonary embolus given contrast bolus timing and soft tissue attenuation from habitus. No  filling defects in the central most pulmonary arteries. Cannot assess distal to the lobar pulmonary branches. Upper normal heart size. No pericardial effusion. Thoracic aorta is normal in caliber. No aortic dissection. Mediastinum/Nodes: Shotty mediastinal lymph nodes, all subcentimeter and likely reactive. No hilar adenopathy. Decompressed esophagus. No suspicious thyroid nodule.  Lungs/Pleura: Breathing motion artifact limits detailed assessment. Only minimal peripheral ground-glass opacities in the left upper lobe. No other focal airspace disease. No pleural effusion. No septal thickening or pulmonary edema. Trachea and mainstem bronchi are patent. Upper Abdomen: Partially included left upper abdominal ventral abdominal wall hernia. Musculoskeletal: Multilevel degenerative change in the spine. There are no acute or suspicious osseous abnormalities. Review of the MIP images confirms the above findings. IMPRESSION: 1. Limited evaluation for pulmonary embolus given contrast bolus timing and soft tissue attenuation from habitus. No filling defects in the central most pulmonary arteries. Cannot assess distal to the lobar branches. 2. Minimal peripheral ground-glass opacities in the left upper lobe, likely infectious in the setting of COVID-19 infection. Minimal parenchymal involvement. Electronically Signed   By: Keith Rake M.D.   On: 10/08/2019 01:28   DG CHEST PORT 1 VIEW  Result Date: 10/18/2019 CLINICAL DATA:  Central line placement. EXAM: PORTABLE CHEST 1 VIEW COMPARISON:  Chest x-ray dated October 14, 2019. FINDINGS: New right upper extremity PICC line with the tip in the lower right atrium. Stable cardiomegaly. Increasing bilateral pleural effusions and bilateral mid and lower lung airspace disease. No pneumothorax. No acute osseous abnormality. IMPRESSION: 1. Right upper extremity PICC line with tip in the low right atrium. Recommend retracting 6 cm. 2. Worsening bilateral airspace disease and pleural effusions. Electronically Signed   By: Titus Dubin M.D.   On: 10/18/2019 16:12   DG Chest Port 1 View  Result Date: 10/14/2019 CLINICAL DATA:  Shortness of breath. COVID positive. EXAM: PORTABLE CHEST 1 VIEW COMPARISON:  Chest x-ray dated 10/10/2019. FINDINGS: Bibasilar opacities are likely increased. Suspected small LEFT pleural effusion. No pneumothorax seen. Heart size  and mediastinal contours appear stable. IMPRESSION: 1. Suspected worsening bibasilar pneumonia. 2. Probable small LEFT pleural effusion. Electronically Signed   By: Franki Cabot M.D.   On: 10/14/2019 13:43   DG Chest Portable 1 View  Result Date: 10/10/2019 CLINICAL DATA:  Cough, dyspnea, COVID-19 [place] EXAM: PORTABLE CHEST 1 VIEW COMPARISON:  10/07/2019 chest radiograph. FINDINGS: Low lung volumes. Stable cardiomediastinal silhouette with normal heart size. No pneumothorax. No pleural effusion. Mild hazy opacities at the lung bases bilaterally, slightly increased. IMPRESSION: Low lung volumes with slightly increased nonspecific mild hazy bibasilar lung opacities, which could represent atelectasis or viral pneumonia. Electronically Signed   By: Ilona Sorrel M.D.   On: 10/10/2019 11:47   DG Chest Portable 1 View  Result Date: 10/07/2019 CLINICAL DATA:  COVID-19 positive EXAM: PORTABLE CHEST 1 VIEW COMPARISON:  Radiograph 10/06/2019 FINDINGS: Increased opacity in the lungs likely related to body habitus. No focal consolidative process is seen. Indeterminate radiodensity projecting to the right of the upper mediastinum. No pneumothorax or effusion. The cardiomediastinal contours are unremarkable. No other acute osseous or soft tissue abnormality. IMPRESSION: Accounting for body habitus, the lungs are clear. Indeterminate metallic radiodensity projecting over the upper chest/paramediastinal border. Possibly external to the patient. Correlate with inspection. Electronically Signed   By: Lovena Le M.D.   On: 10/07/2019 20:56   [place] EKG SITE RITE  Result Date: 10/18/2019 If Site Rite image not attached, placement could not be confirmed due to current cardiac rhythm.  Subjective:  No complaints feels great. Discharge Exam: Vitals:   10/23/19 0348 10/23/19 0737  BP: 104/66 (!) 108/51  Pulse: 77 79  Resp:  20  Temp: 98.6 F (37 C) 97.8 F (36.6 C)  SpO2: 92% 94%   Vitals:   10/22/19 1639  10/22/19 2000 10/23/19 0348 10/23/19 0737  BP: 133/63 (!) 114/59 104/66 (!) 108/51  Pulse: 86 67 77 79  Resp: '18 20  20  ' Temp: 98.1 F (36.7 C) 99.1 F (37.3 C) 98.6 F (37 C) 97.8 F (36.6 C)  TempSrc: Oral Oral Oral Oral  SpO2: 94% 91% 92% 94%  Weight:      Height:        General: Pt is alert, awake, not in acute distress Cardiovascular: RRR, S1/S2 +, no rubs, no gallops Respiratory: CTA bilaterally, no wheezing, no rhonchi Abdominal: Soft, NT, ND, bowel sounds + Extremities: no edema, no cyanosis    The results of significant diagnostics from this hospitalization (including imaging, microbiology, ancillary and laboratory) are listed below for reference.     Microbiology: Recent Results (from the past 240 hour(s))  Blood Culture (routine x 2)     Status: None   Collection Time: 10/14/19  2:05 PM   Specimen: Right Antecubital; Blood  Result Value Ref Range Status   Specimen Description   Final    RIGHT ANTECUBITAL Performed at [facility] [address], [place], [place] [address]    Special Requests   Final    BOTTLES DRAWN AEROBIC AND ANAEROBIC Blood Culture adequate volume Performed at [facility] [address] [place], [place] [address]    Culture   Final    NO GROWTH 5 DAYS Performed at [facility], [place] [address] [place], [place] [address]    Report Status 10/19/2019 FINAL  Final     Labs: BNP (last 3 results) No results for input(s): BNP in the last 8760 hours. Basic Metabolic Panel: Recent Labs  Lab 10/19/19 0651 10/20/19 0247 10/21/19 0346 10/22/19 0354 10/23/19 0609  NA 140 138 138 136 140  K 4.2 4.3 4.5 4.9 3.8  CL 105 98 95* 95* 99  CO2 '27 25 30 30 30  ' GLUCOSE 151* 177* 193* 280* 95  BUN 25* 32* 39* 40* 40*  CREATININE 1.13 1.35* 1.38* 1.39* 1.35*  CALCIUM 9.1 9.3 9.5 9.1 9.2   Liver Function Tests: Recent Labs  Lab 10/17/19 0323 10/18/19 0345 10/19/19 0651  AST '24 18 27  ' ALT '27  24 26  ' ALKPHOS 53 54 50  BILITOT 0.4 0.7 0.9  PROT 7.0 6.9 6.7  ALBUMIN 2.3* 2.4* 2.5*   No results for input(s): LIPASE, AMYLASE in the last 168 hours. No results for input(s): AMMONIA in the last 168 hours. CBC: Recent Labs  Lab 10/18/19 0345 10/20/19 0247 10/21/19 0346 10/22/19 0354 10/23/19 0609  WBC 18.0* 31.8* 29.6* 23.3* 15.7*  NEUTROABS  --  28.3* 26.0* 19.7* 10.0*  HGB 14.7 15.5 15.3 14.6 15.3  HCT 44.6 47.8 47.4 44.8 47.3  MCV 95.5 97.6 95.8 95.5 95.7  PLT 535* 582* 604* 532* 485*   Cardiac Enzymes: No results for input(s): CKTOTAL, CKMB, CKMBINDEX, TROPONINI in the last 168 hours. BNP: Invalid input(s): POCBNP CBG: Recent Labs  Lab 10/22/19 0823 10/22/19 1111 10/22/19 1639 10/23/19 0030 10/23/19 0344  GLUCAP 193* 199* 138* 163* 121*   D-Dimer No results for input(s): DDIMER in the last 72 hours. Hgb A1c No results for input(s): HGBA1C  in the last 72 hours. Lipid Profile No results for input(s): CHOL, HDL, LDLCALC, TRIG, CHOLHDL, LDLDIRECT in the last 72 hours. Thyroid function studies No results for input(s): TSH, T4TOTAL, T3FREE, THYROIDAB in the last 72 hours.  Invalid input(s): FREET3 Anemia work up No results for input(s): VITAMINB12, FOLATE, FERRITIN, TIBC, IRON, RETICCTPCT in the last 72 hours. Urinalysis    Component Value Date/Time   COLORURINE YELLOW 10/14/2019 1613   APPEARANCEUR CLEAR 10/14/2019 1613   LABSPEC 1.027 10/14/2019 1613   PHURINE 5.0 10/14/2019 1613   GLUCOSEU >=500 (A) 10/14/2019 1613   HGBUR NEGATIVE 10/14/2019 1613   BILIRUBINUR NEGATIVE 10/14/2019 1613   KETONESUR 80 (A) 10/14/2019 1613   PROTEINUR 30 (A) 10/14/2019 1613   NITRITE NEGATIVE 10/14/2019 1613   LEUKOCYTESUR NEGATIVE 10/14/2019 1613   Sepsis Labs Invalid input(s): PROCALCITONIN,  WBC,  LACTICIDVEN Microbiology Recent Results (from the past 240 hour(s))  Blood Culture (routine x 2)     Status: None   Collection Time: 10/14/19  2:05 PM   Specimen:  Right Antecubital; Blood  Result Value Ref Range Status   Specimen Description   Final    RIGHT ANTECUBITAL Performed at [facility], [place] [address] [place], [place] [address]    Special Requests   Final    BOTTLES DRAWN AEROBIC AND ANAEROBIC Blood Culture adequate volume Performed at [facility] [address] [place], [place] [address]    Culture   Final    NO GROWTH 5 DAYS Performed at [facility], [place] [address] [place], [place] [address]    Report Status 10/19/2019 FINAL  Final     Time coordinating discharge: Over 40 minutes  SIGNED:   Charlynne Cousins, MD  [facility] 10/23/2019, 8:24 AM Pager   If 7PM-7AM, please contact night-coverage [email or web address] Password [email or web address]

## 2019-10-23 NOTE — Plan of Care (Signed)
Adequate for discharge.

## 2019-11-20 ENCOUNTER — Other Ambulatory Visit: Payer: Self-pay

## 2021-04-20 IMAGING — DX DG CHEST 1V PORT
2 series · 2 of 2 positions shown · non-contrast
Comparison: Radiograph 10/06/2019

CLINICAL DATA: [id number] positive

EXAM:
PORTABLE CHEST 1 VIEW

[chest ap (1 of 2)]
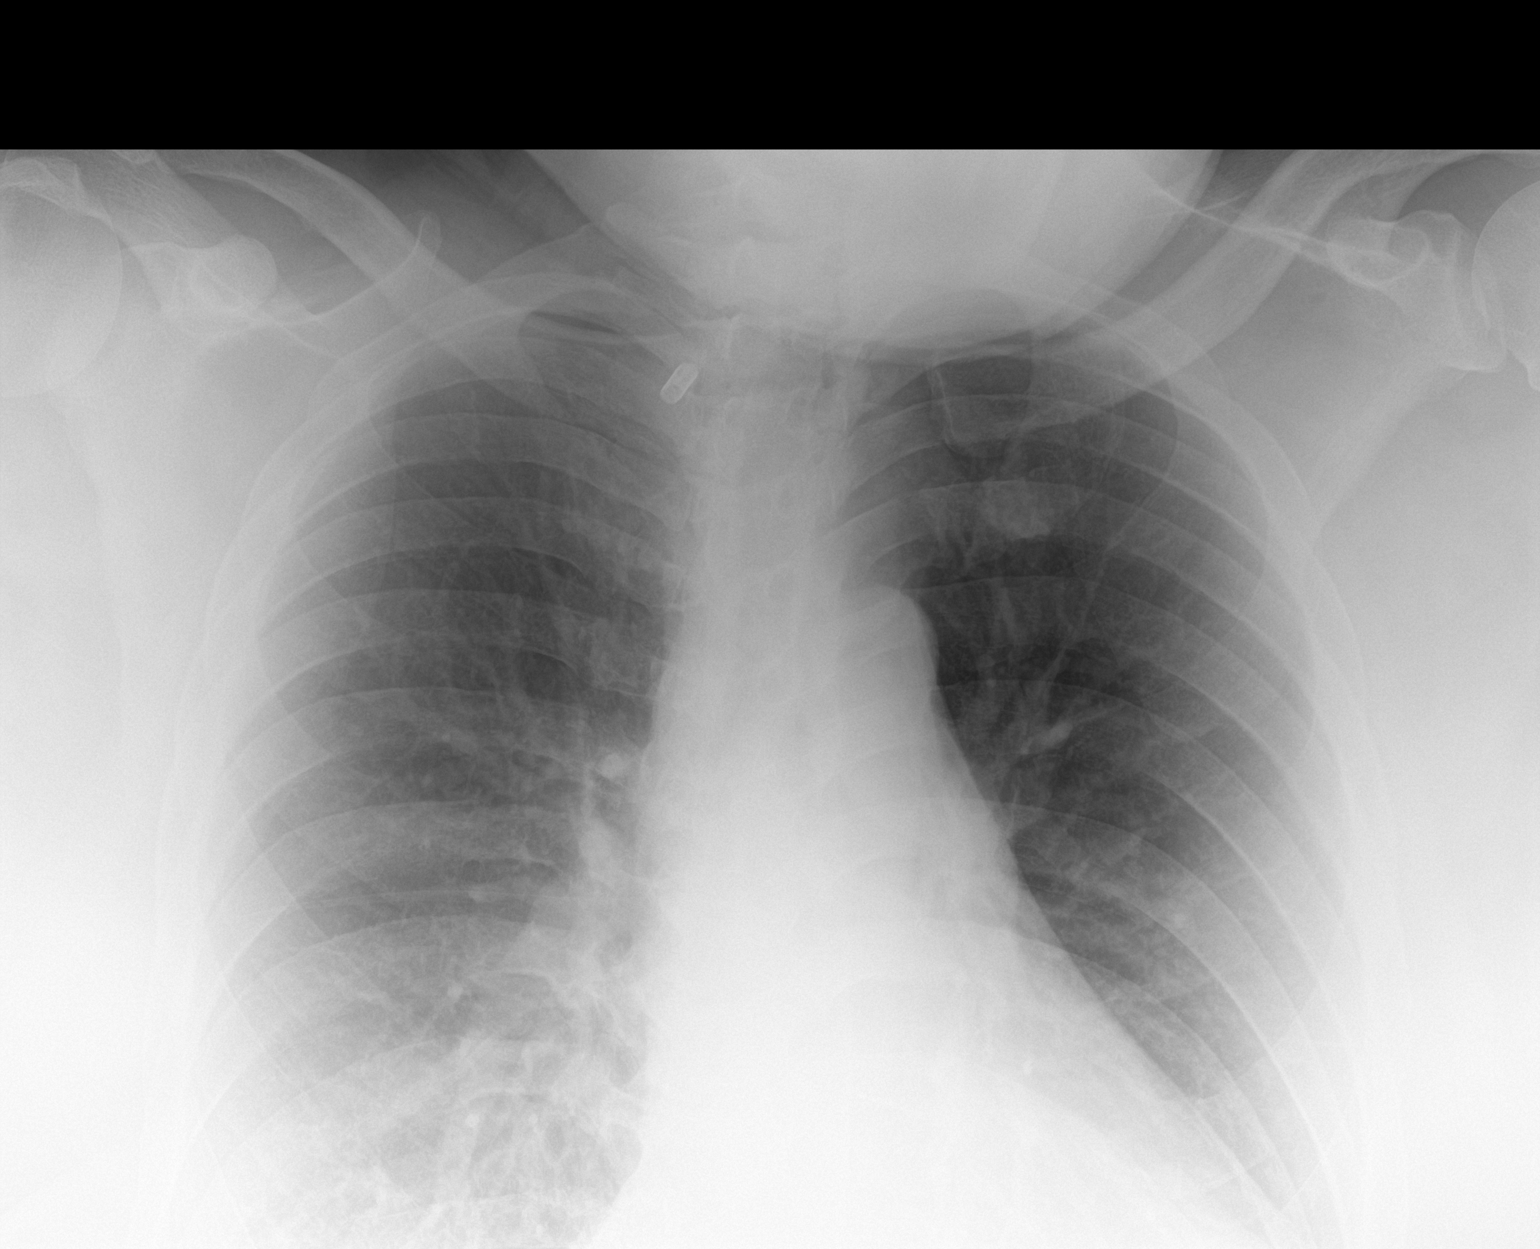

[chest ap (2 of 2)]
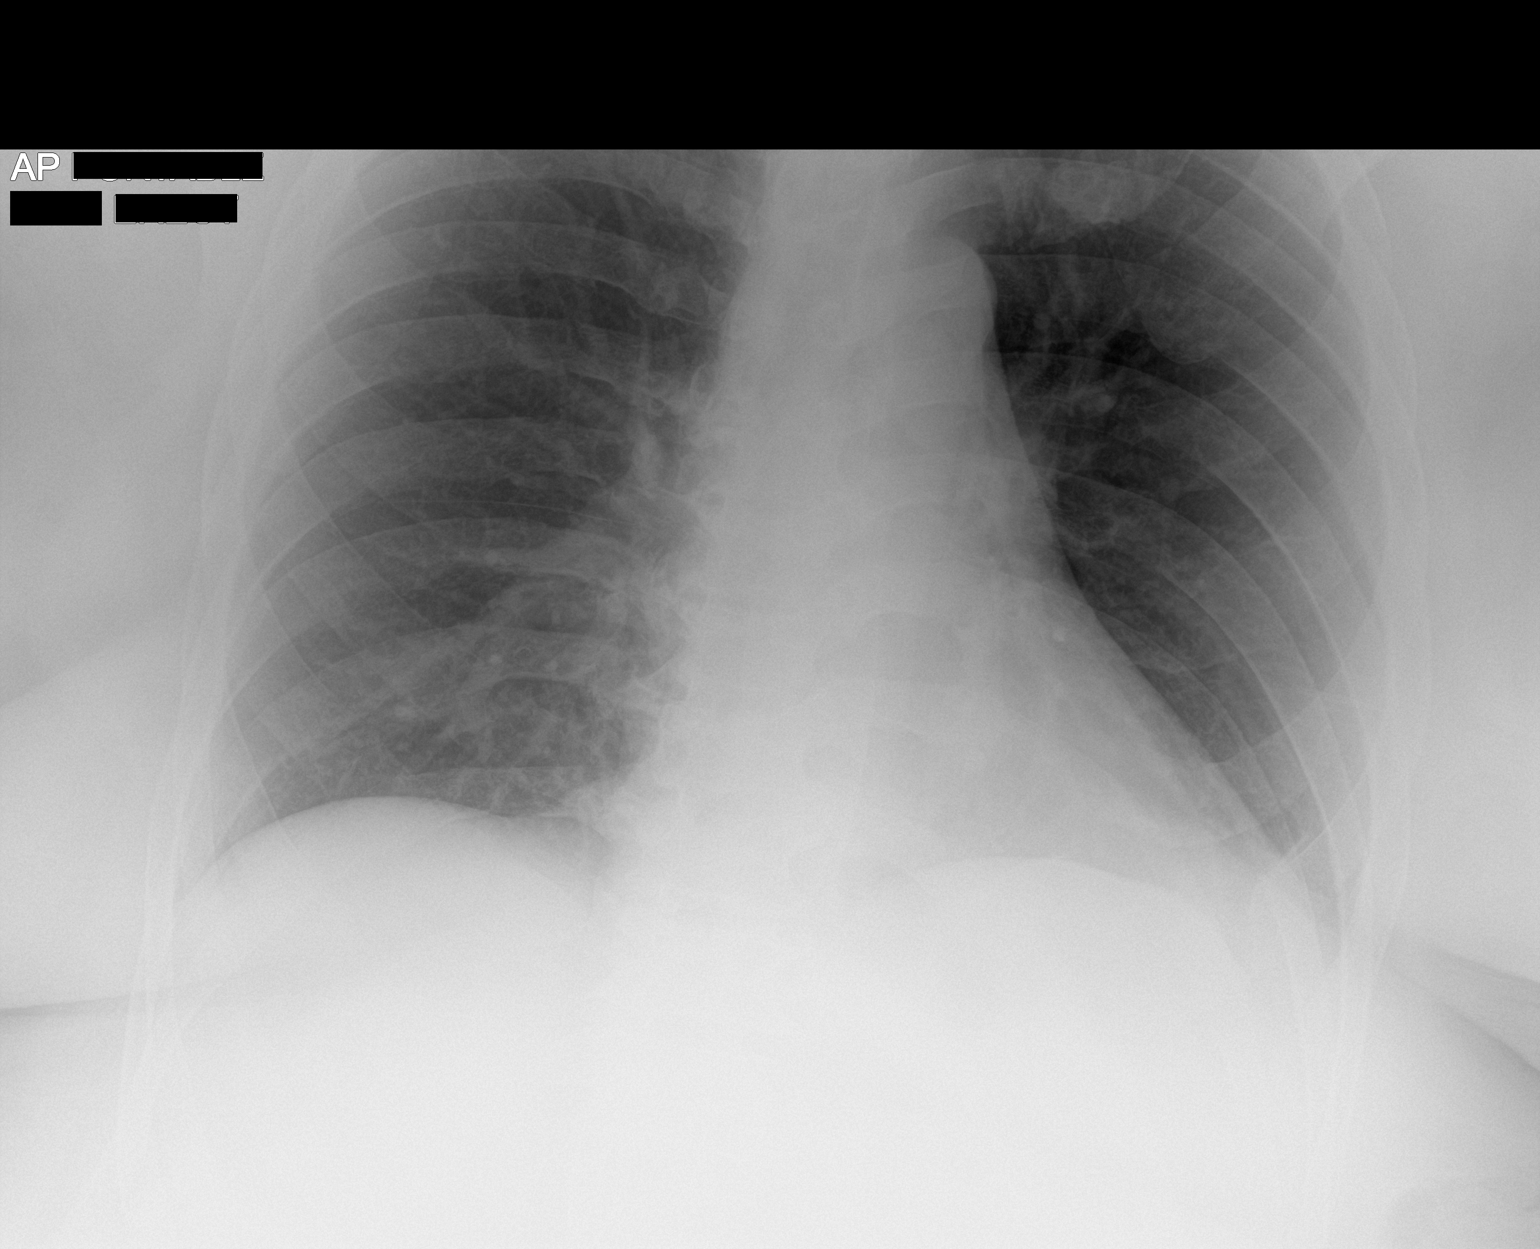

[2 of 2 positions shown; findings below may reference images not displayed]

FINDINGS: Increased opacity in the lungs likely related to body habitus. No
focal consolidative process is seen. Indeterminate radiodensity
projecting to the right of the upper mediastinum. No pneumothorax or
effusion. The cardiomediastinal contours are unremarkable. No other
acute osseous or soft tissue abnormality.
IMPRESSION: Accounting for body habitus, the lungs are clear.

Indeterminate metallic radiodensity projecting over the upper
chest/paramediastinal border. Possibly external to the patient.
Correlate with inspection.

## 2021-04-21 IMAGING — CT CT ANGIO CHEST
2 of 6 series · 18 of 36 positions shown · IV contrast (omnipaque)
Comparison: Radiograph yesterday. Radiograph 10/06/2019

CLINICAL DATA: Shortness of breath. COVID positive yesterday. Cough
and body aches.

EXAM:
CT ANGIOGRAPHY CHEST WITH CONTRAST
TECHNIQUE: Multidetector CT imaging of the chest was performed using the
standard protocol during bolus administration of intravenous
contrast. Multiplanar CT image reconstructions and MIPs were
obtained to evaluate the vascular anatomy.
CONTRAST:  100mL OMNIPAQUE IOHEXOL 350 MG/ML SOLN

[Series 5: thins · axial · 0.80mm/px · z∈[-306,-53]mm · 17 of 285 slices shown]
[im 16/285  lung]
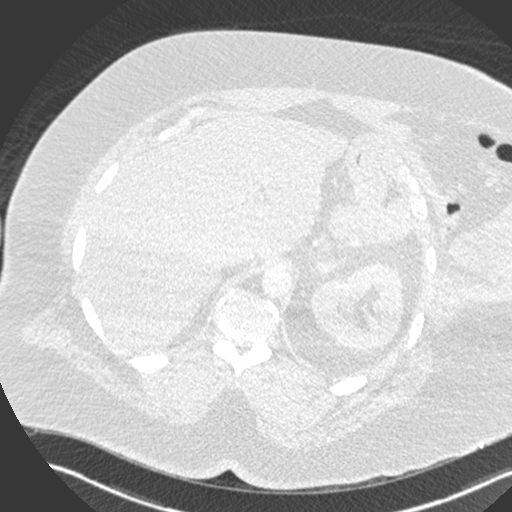
[im 32/285  mediastinal]
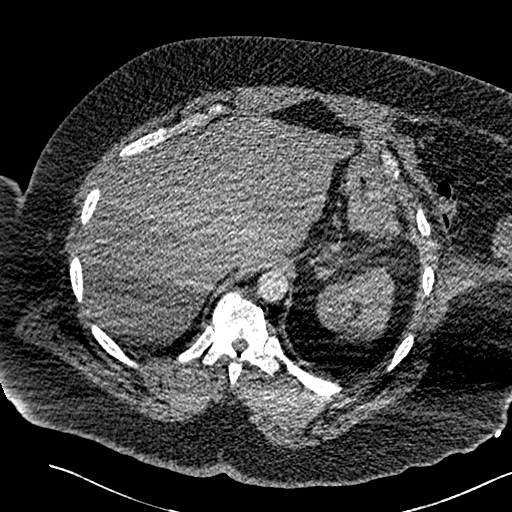
[im 48/285  lung]
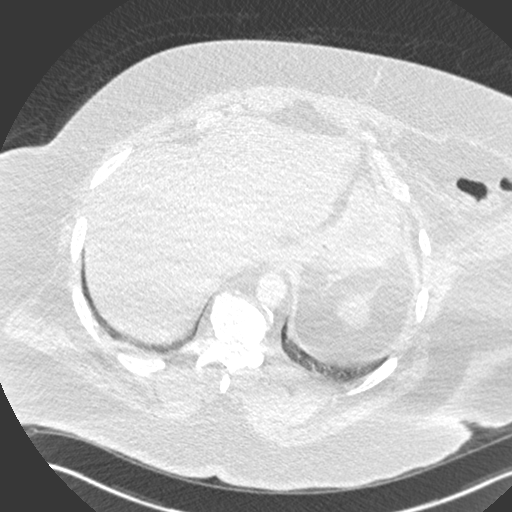
[im 64/285  mediastinal]
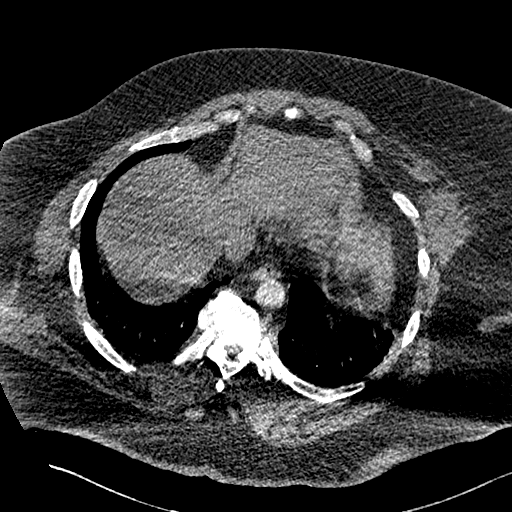
[im 79/285  lung]
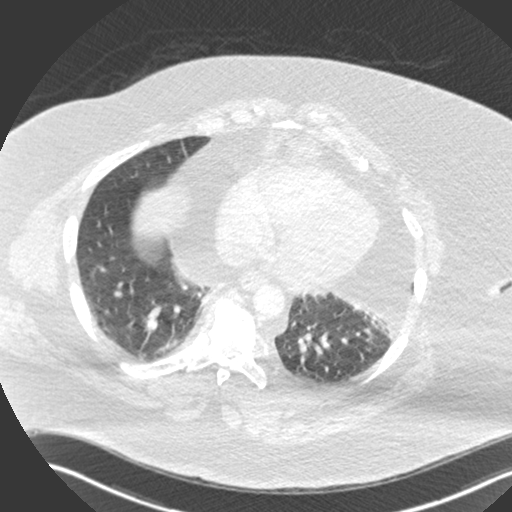
[im 95/285  mediastinal]
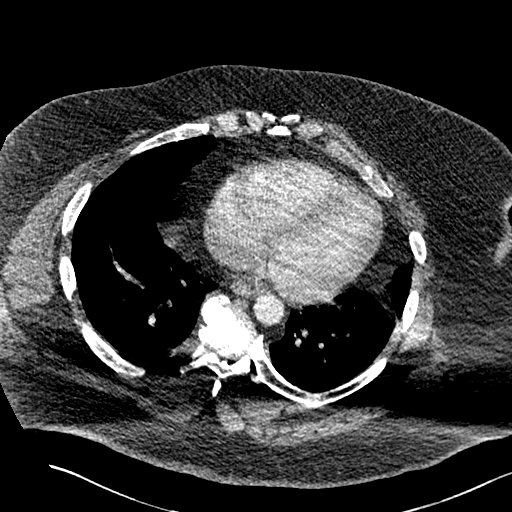
[im 111/285  lung]
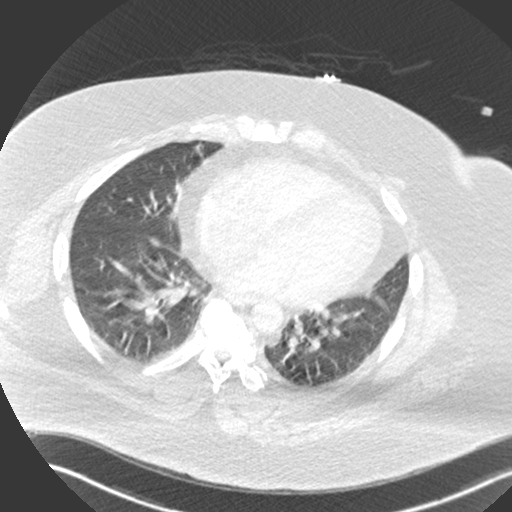
[im 127/285  mediastinal]
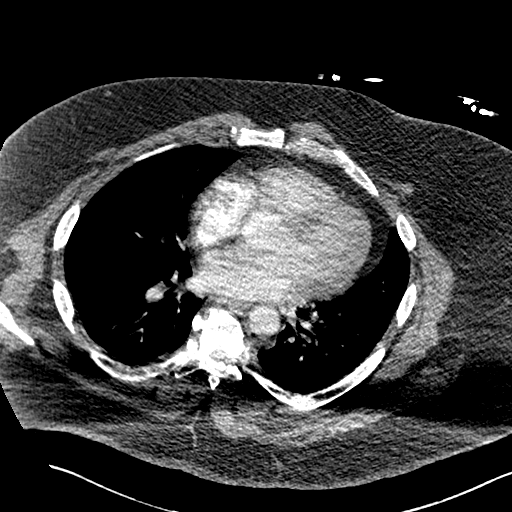
[im 143/285  lung]
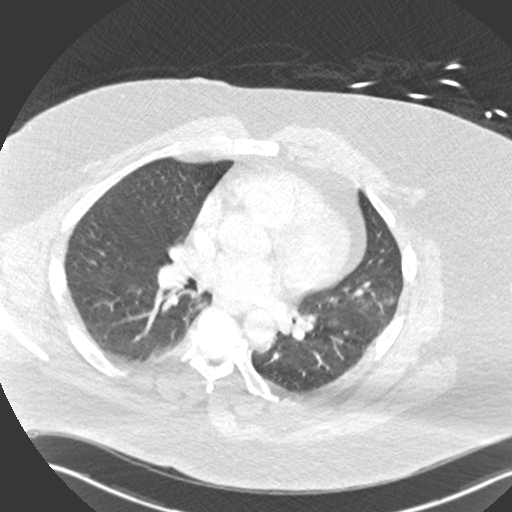
[im 158/285  mediastinal]
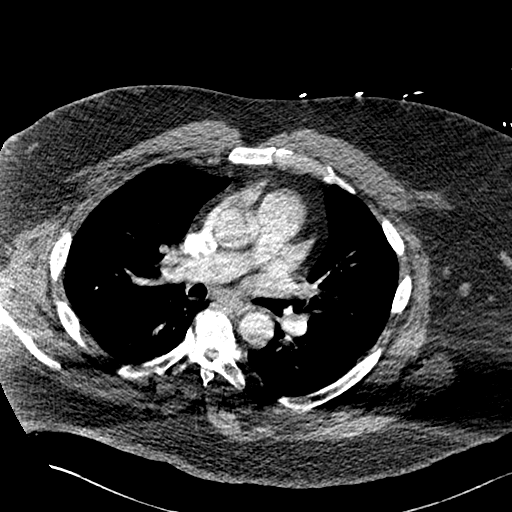
[im 174/285  lung]
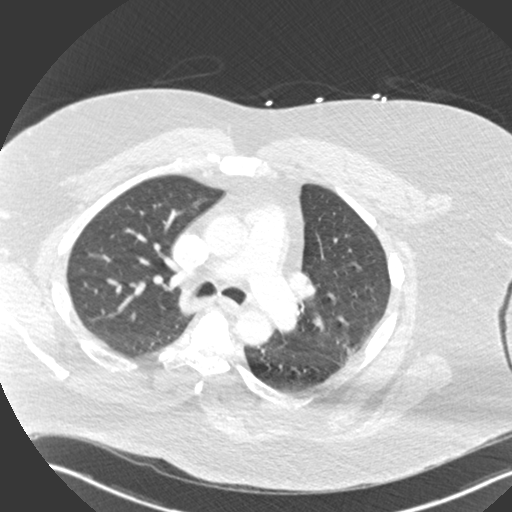
[im 190/285  mediastinal]
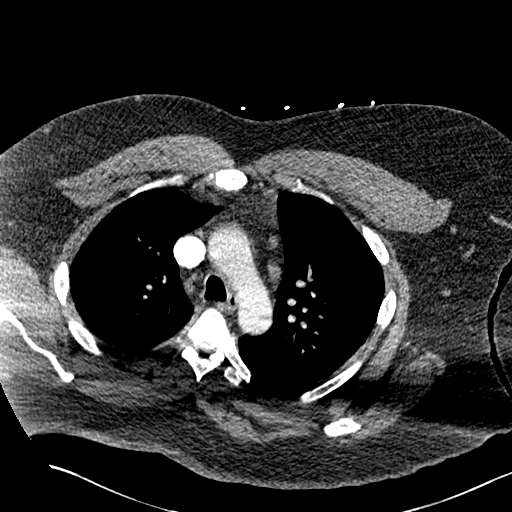
[im 206/285  lung]
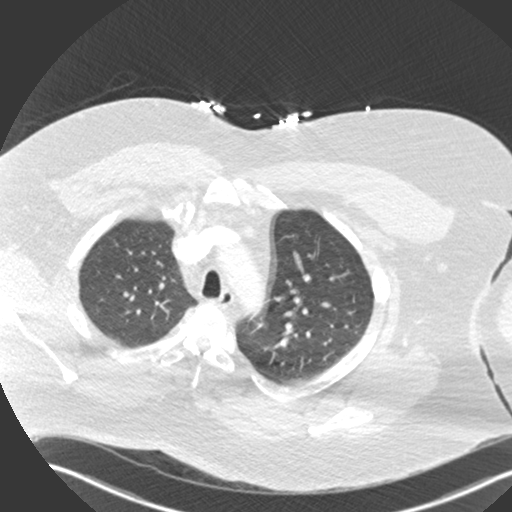
[im 221/285  mediastinal]
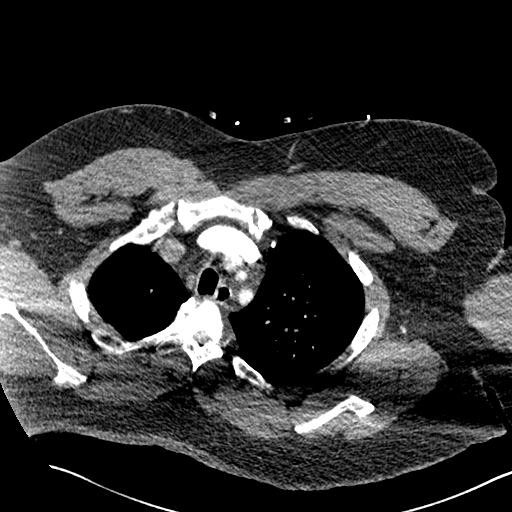
[im 237/285  lung]
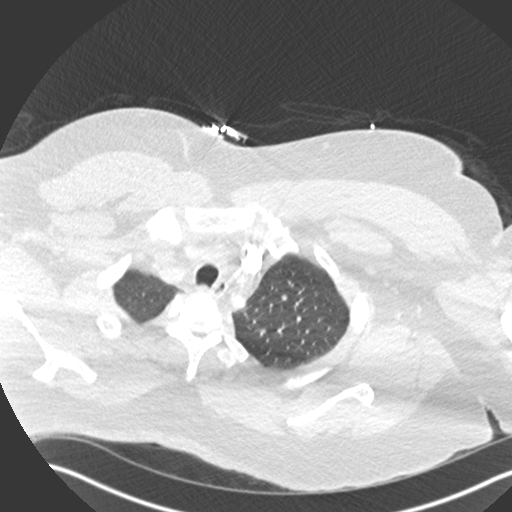
[im 253/285  mediastinal]
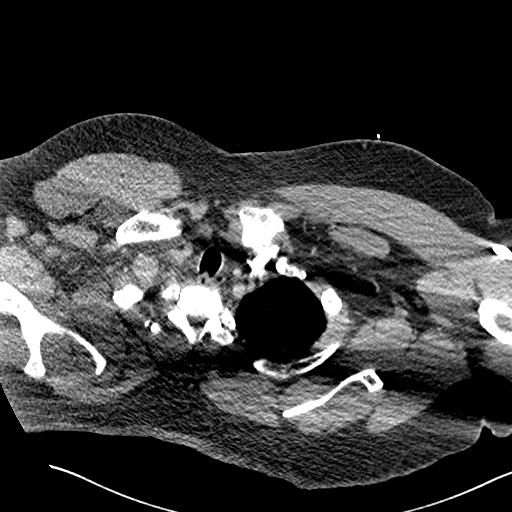
[im 269/285  lung]
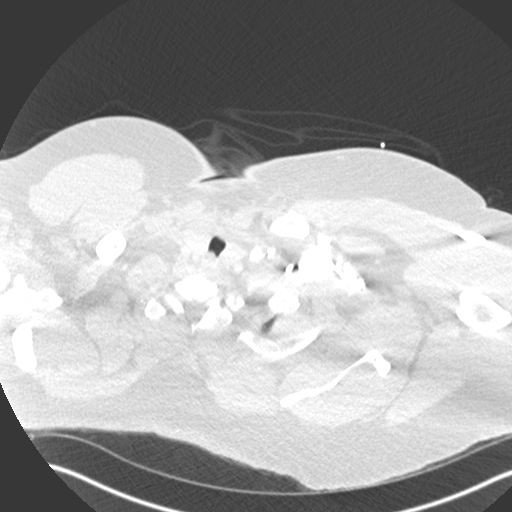

[Series 7: coronal mpr · coronal · 0.58mm/px · 1 of 169 slices shown]
[im 85/169  mediastinal]
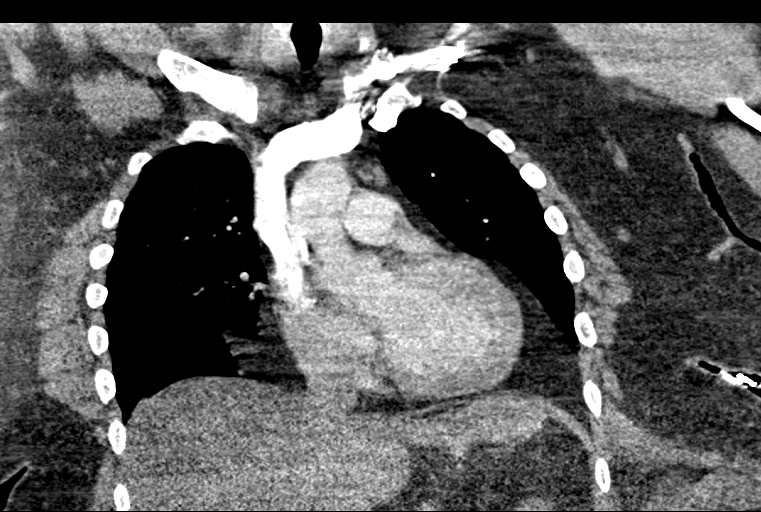

[18 of 36 positions shown; findings below may reference images not displayed]

FINDINGS: Cardiovascular: Significantly limited evaluation for pulmonary
embolus given contrast bolus timing and soft tissue attenuation from
habitus. No filling defects in the central most pulmonary arteries.
Cannot assess distal to the lobar pulmonary branches. Upper normal
heart size. No pericardial effusion. Thoracic aorta is normal in
caliber. No aortic dissection.

Mediastinum/Nodes: Shotty mediastinal lymph nodes, all subcentimeter
and likely reactive. No hilar adenopathy. Decompressed esophagus. No
suspicious thyroid nodule.

Lungs/Pleura: Breathing motion artifact limits detailed assessment.
Only minimal peripheral ground-glass opacities in the left upper
lobe. No other focal airspace disease. No pleural effusion. No
septal thickening or pulmonary edema. Trachea and mainstem bronchi
are patent.

Upper Abdomen: Partially included left upper abdominal ventral
abdominal wall hernia.

Musculoskeletal: Multilevel degenerative change in the spine. There
are no acute or suspicious osseous abnormalities.

Review of the MIP images confirms the above findings.
IMPRESSION: 1. Limited evaluation for pulmonary embolus given contrast bolus
timing and soft tissue attenuation from habitus. No filling defects
in the central most pulmonary arteries. Cannot assess distal to the
lobar branches.
2. Minimal peripheral ground-glass opacities in the left upper lobe,
likely infectious in the setting of [id number] infection. Minimal
parenchymal involvement.
